# Patient Record
Sex: Female | Born: 1990 | Race: Black or African American | Hispanic: No | Marital: Single | Smoking: Never smoker
Health system: Southern US, Community
[De-identification: ages and names within clinical notes are randomized; demographics above are authoritative.]

---

## 2017-07-07 ENCOUNTER — Encounter: Payer: Self-pay | Admitting: Family Medicine

## 2017-07-07 ENCOUNTER — Other Ambulatory Visit: Payer: Self-pay

## 2017-07-07 ENCOUNTER — Ambulatory Visit: Payer: Managed Care, Other (non HMO) | Admitting: Family Medicine

## 2017-07-07 VITALS — BP 126/78 | HR 87 | Temp 98.5°F | Ht 67.72 in | Wt 223.0 lb

## 2017-07-07 DIAGNOSIS — Z30011 Encounter for initial prescription of contraceptive pills: Secondary | ICD-10-CM

## 2017-07-07 DIAGNOSIS — Z13220 Encounter for screening for lipoid disorders: Secondary | ICD-10-CM

## 2017-07-07 DIAGNOSIS — Z131 Encounter for screening for diabetes mellitus: Secondary | ICD-10-CM | POA: Diagnosis not present

## 2017-07-07 DIAGNOSIS — Z23 Encounter for immunization: Secondary | ICD-10-CM

## 2017-07-07 DIAGNOSIS — N920 Excessive and frequent menstruation with regular cycle: Secondary | ICD-10-CM | POA: Diagnosis not present

## 2017-07-07 DIAGNOSIS — Z01419 Encounter for gynecological examination (general) (routine) without abnormal findings: Secondary | ICD-10-CM

## 2017-07-07 LAB — POCT URINE PREGNANCY: Preg Test, Ur: NEGATIVE

## 2017-07-07 MED ORDER — NORGESTIMATE-ETH ESTRADIOL 0.25-35 MG-MCG PO TABS: 1.0000 | ORAL_TABLET | Freq: Every day | ORAL | 11 refills | Status: DC

## 2017-07-07 NOTE — Progress Notes (Signed)
7/79/390300:92 AM  Sheryl Keller [birth year], 27 y.o. female [id number]  Chief Complaint  Patient presents with  . Establish Care    HPI:   Patient is a 27 y.o. female who presents today for annual exam.   Last CPE 2017 Last pap 2 years ago, normal Denies h/o abnl pap or STDs Menarche at age 23 Monthly, 7 days, heavy with clots, uses tampons and pads together, changes about every hour, no very painful, but does report a chronic intermittent R pelvic pain, does not think its related to menses LMP 06/12/17 G0 Currently not sexually active. Men Last sexual encounter 2015, has been tested for STDs since then, negative  She has been working really hard on losing weight with diet and exercise Has lost 70lbs Immunizations reviewed, declines flu vaccine  Denies h/o migraines, VTE, her mother had a CVA at age 80 - very high uncontrolled HTN No fhx breast or ovarian cancer   Immunization History  Administered Date(s) Administered  . DTaP 07/14/1993, 10/07/1993, 11/12/1993, 09/06/1994, 06/25/1998  . HPV Quadrivalent 11/29/2011  . Hepatitis A 04/26/2010, 11/29/2011  . Hepatitis B April 11, 1993, 08/05/1993, 07/24/1995  . HiB 07/14/1993, 10/07/1993, 11/12/1993, 09/06/1994  . IPV 07/14/1993, 10/07/1993, 09/06/1994, 06/25/1998  . MMR 09/06/1994, 06/25/1998  . Meningococcal Conjugate 04/26/2010  . Td 04/26/2010  . Tdap 04/26/2010  . Varicella 07/24/1995, 04/26/2010    Depression screen PHQ 2/9 07/07/2017  Decreased Interest 0  Down, Depressed, Hopeless 0  PHQ - 2 Score 0    Not on File  Prior to Admission medications   Not on File    History reviewed. No pertinent past medical history.  History reviewed. No pertinent surgical history.  Social History   Tobacco Use  . Smoking status: Never Smoker  . Smokeless tobacco: Never Used  Substance Use Topics  . Alcohol use: No    Frequency: Never    Family History  Problem Relation Age of Onset  . Hypertension Mother     . Diabetes Mother   . Stroke Mother   . Healthy Father   . Stroke Maternal Grandmother   . Diabetes Maternal Grandfather     Review of Systems  Constitutional: Negative for chills and fever.  HENT: Negative for congestion, ear pain and sore throat.   Eyes: Negative for blurred vision and double vision.  Respiratory: Negative for cough and shortness of breath.   Cardiovascular: Negative for chest pain, palpitations and leg swelling.  Gastrointestinal: Negative for abdominal pain, nausea and vomiting.  Genitourinary: Negative for dysuria and hematuria.       Neg breast lumps or nipple discharge Neg vaginal discharge, dyspareunia  Musculoskeletal: Negative for joint pain and myalgias.  Neurological: Negative for dizziness and headaches.  Endo/Heme/Allergies: Does not bruise/bleed easily.  Psychiatric/Behavioral: Negative for depression. The patient is not nervous/anxious.     OBJECTIVE:  Blood pressure 126/78, pulse 87, temperature 98.5 F (36.9 C), temperature source Oral, height 5' 7.72" (1.72 m), weight 223 lb (101.2 kg), last menstrual period 06/12/2017, SpO2 100 %.  Physical Exam  Constitutional: She is oriented to person, place, and time and well-developed, well-nourished, and in no distress.  HENT:  Head: Normocephalic and atraumatic.  Right Ear: Hearing, tympanic membrane, external ear and ear canal normal.  Left Ear: Hearing, tympanic membrane, external ear and ear canal normal.  Mouth/Throat: Oropharynx is clear and moist.  Eyes: EOM are normal. Pupils are equal, round, and reactive to light.  Neck: Neck supple. No thyromegaly present.  Cardiovascular: Normal rate,  regular rhythm, normal heart sounds and intact distal pulses. Exam reveals no gallop and no friction rub.  No murmur heard. Pulmonary/Chest: Effort normal and breath sounds normal. She has no wheezes. She has no rales.  Abdominal: Soft. Bowel sounds are normal. She exhibits no distension and no mass. There  is no tenderness.  Genitourinary: Uterus is not enlarged and not fixed.  Cervix is not fixed. Cervix exhibits no motion tenderness and no lesion. Right adnexum displays no mass and no tenderness. Left adnexum displays no mass and no tenderness. Vulva exhibits no erythema, no lesion and no rash. Vagina exhibits rugosity. Vagina exhibits normal mucosa. Watery  white and vaginal discharge found.  Musculoskeletal: Normal range of motion. She exhibits no edema.  Lymphadenopathy:    She has no cervical adenopathy.  Neurological: She is alert and oriented to person, place, and time. She has normal reflexes. Gait normal.  Skin: Skin is warm and dry.  Psychiatric: Mood and affect normal.  Nursing note and vitals reviewed.   Results for orders placed or performed in visit on 07/07/17 (from the past 24 hour(s))  POCT urine pregnancy     Status: None   Collection Time: 07/07/17 11:30 AM  Result Value Ref Range   Preg Test, Ur Negative Negative    ASSESSMENT and PLAN  1. Encounter for annual routine gynecological examination No concerns per history or exam. Routine HCM labs ordered. HCM reviewed/discussed. Anticipatory guidance regarding healthy weight, lifestyle and choices given.  - Td vaccine greater than or equal to 7yo preservative free IM - CBC - Comprehensive metabolic panel - Pap IG w/ reflex to HPV when ASC-U  2. Encounter for initial prescription of contraceptive pills - POCT urine pregnancy  3. Menorrhagia with regular cycle Treatment options discussed. [place] to r/o structural causes and for right pelvica pain. Discussed new med r/se/b. Patient educational handout given  - CBC - TSH - US PELVIC COMPLETE WITH TRANSVAGINAL; Future  4. Need for vaccination - HPV 9-valent vaccine,Recombinat  5. Screening for lipid disorders - Lipid panel  6. Screening for diabetes mellitus - Hemoglobin A1c  Other orders - norgestimate-ethinyl estradiol (ORTHO-CYCLEN,SPRINTEC,PREVIFEM) 0.25-35  MG-MCG tablet; Take 1 tablet by mouth daily.  Return in about 4 weeks (around 08/04/2017).    Rutherford Guys, MD Primary Care at [facility] [place], [place] [address] Ph.  [phone] Fax [phone]

## 2017-07-07 NOTE — Patient Instructions (Addendum)
1. Next HPV vaccine due in 3 months    IF you received an x-ray today, you will receive an invoice from [facility]. Please contact [facility] at [phone] with questions or concerns regarding your invoice.   IF you received labwork today, you will receive an invoice from [facility]. Please contact [facility] at [phone] with questions or concerns regarding your invoice.   Our billing staff will not be able to assist you with questions regarding bills from these companies.  You will be contacted with the lab results as soon as they are available. The fastest way to get your results is to activate your My Chart account. Instructions are located on the last page of this paperwork. If you have not heard from [place] regarding the results in 2 weeks, please contact this office.     Preventive Care 18-39 Years, Female Preventive care refers to lifestyle choices and visits with your health care provider that can promote health and wellness. What does preventive care include?  A yearly physical exam. This is also called an annual well check.  Dental exams once or twice a year.  Routine eye exams. Ask your health care provider how often you should have your eyes checked.  Personal lifestyle choices, including: ? Daily care of your teeth and gums. ? Regular physical activity. ? Eating a healthy diet. ? Avoiding tobacco and drug use. ? Limiting alcohol use. ? Practicing safe sex. ? Taking vitamin and mineral supplements as recommended by your health care provider. What happens during an annual well check? The services and screenings done by your health care provider during your annual well check will depend on your age, overall health, lifestyle risk factors, and family history of disease. Counseling Your health care provider may ask you questions about your:  Alcohol use.  Tobacco use.  Drug use.  Emotional well-being.  Home and relationship well-being.  Sexual  activity.  Eating habits.  Work and work Statistician.  Method of birth control.  Menstrual cycle.  Pregnancy history.  Screening You may have the following tests or measurements:  Height, weight, and BMI.  Diabetes screening. This is done by checking your blood sugar (glucose) after you have not eaten for a while (fasting).  Blood pressure.  Lipid and cholesterol levels. These may be checked every 5 years starting at age 4.  Skin check.  Hepatitis C blood test.  Hepatitis B blood test.  Sexually transmitted disease (STD) testing.  BRCA-related cancer screening. This may be done if you have a family history of breast, ovarian, tubal, or peritoneal cancers.  Pelvic exam and Pap test. This may be done every 3 years starting at age 34. Starting at age 34, this may be done every 5 years if you have a Pap test in combination with an HPV test.  Discuss your test results, treatment options, and if necessary, the need for more tests with your health care provider. Vaccines Your health care provider may recommend certain vaccines, such as:  Influenza vaccine. This is recommended every year.  Tetanus, diphtheria, and acellular pertussis (Tdap, Td) vaccine. You may need a Td booster every 10 years.  Varicella vaccine. You may need this if you have not been vaccinated.  HPV vaccine. If you are 49 or younger, you may need three doses over 6 months.  Measles, mumps, and rubella (MMR) vaccine. You may need at least one dose of MMR. You may also need a second dose.  Pneumococcal 13-valent conjugate (PCV13) vaccine. You may  need this if you have certain conditions and were not previously vaccinated.  Pneumococcal polysaccharide (PPSV23) vaccine. You may need one or two doses if you smoke cigarettes or if you have certain conditions.  Meningococcal vaccine. One dose is recommended if you are age 66-21 years and a first-year college student living in a residence hall, or if you have  one of several medical conditions. You may also need additional booster doses.  Hepatitis A vaccine. You may need this if you have certain conditions or if you travel or work in places where you may be exposed to hepatitis A.  Hepatitis B vaccine. You may need this if you have certain conditions or if you travel or work in places where you may be exposed to hepatitis B.  Haemophilus influenzae type b (Hib) vaccine. You may need this if you have certain risk factors.  Talk to your health care provider about which screenings and vaccines you need and how often you need them. This information is not intended to replace advice given to you by your health care provider. Make sure you discuss any questions you have with your health care provider. Document Released: 07/05/2001 Document Revised: 01/27/2016 Document Reviewed: 03/10/2015 Elsevier Interactive Patient Education  [employer].

## 2017-07-08 LAB — COMPREHENSIVE METABOLIC PANEL
ALT: 15 IU/L (ref 0–32)
AST: 16 IU/L (ref 0–40)
Albumin/Globulin Ratio: 1.3 (ref 1.2–2.2)
Albumin: 3.9 g/dL (ref 3.5–5.5)
Alkaline Phosphatase: 64 IU/L (ref 39–117)
BUN/Creatinine Ratio: 15 (ref 9–23)
BUN: 11 mg/dL (ref 6–20)
Bilirubin Total: <0.2 mg/dL (ref 0.0–1.2)
CO2: 21 mmol/L (ref 20–29)
Calcium: 9.3 mg/dL (ref 8.7–10.2)
Chloride: 103 mmol/L (ref 96–106)
Creatinine, Ser: 0.71 mg/dL (ref 0.57–1.00)
GFR calc Af Amer: 138 mL/min/{1.73_m2} (ref >59)
GFR calc non Af Amer: 120 mL/min/{1.73_m2} (ref >59)
Globulin, Total: 3.1 g/dL (ref 1.5–4.5)
Glucose: 85 mg/dL (ref 65–99)
Potassium: 4.6 mmol/L (ref 3.5–5.2)
Sodium: 138 mmol/L (ref 134–144)
Total Protein: 7 g/dL (ref 6.0–8.5)

## 2017-07-08 LAB — LIPID PANEL
Chol/HDL Ratio: 2.6 ratio (ref 0.0–4.4)
Cholesterol, Total: 151 mg/dL (ref 100–199)
HDL: 59 mg/dL (ref >39)
LDL Calculated: 85 mg/dL (ref 0–99)
Triglycerides: 36 mg/dL (ref 0–149)
VLDL Cholesterol Cal: 7 mg/dL (ref 5–40)

## 2017-07-08 LAB — CBC
Hematocrit: 33.5 % — ABNORMAL LOW (ref 34.0–46.6)
Hemoglobin: 9.8 g/dL — ABNORMAL LOW (ref 11.1–15.9)
MCH: 21.8 pg — ABNORMAL LOW (ref 26.6–33.0)
MCHC: 29.3 g/dL — ABNORMAL LOW (ref 31.5–35.7)
MCV: 75 fL — ABNORMAL LOW (ref 79–97)
Platelets: 391 10*3/uL — ABNORMAL HIGH (ref 150–379)
RBC: 4.49 x10E6/uL (ref 3.77–5.28)
RDW: 20.9 % — ABNORMAL HIGH (ref 12.3–15.4)
WBC: 6.5 10*3/uL (ref 3.4–10.8)

## 2017-07-08 LAB — TSH: TSH: 1.52 u[IU]/mL (ref 0.450–4.500)

## 2017-07-08 LAB — HEMOGLOBIN A1C
Est. average glucose Bld gHb Est-mCnc: 105 mg/dL
Hgb A1c MFr Bld: 5.3 % (ref 4.8–5.6)

## 2017-07-11 ENCOUNTER — Telehealth: Payer: Self-pay | Admitting: Family Medicine

## 2017-07-11 ENCOUNTER — Other Ambulatory Visit: Payer: Self-pay | Admitting: Family Medicine

## 2017-07-11 MED ORDER — FERROUS GLUCONATE 324 (38 FE) MG PO TABS: 324.0000 mg | ORAL_TABLET | Freq: Every day | ORAL | 3 refills | Status: DC

## 2017-07-11 NOTE — Telephone Encounter (Signed)
Called and left pt a VM asking her to call the office and reschedule her appt she has scheduled with Dr. Leretha PolSantiago on 08/04/17.  When pt calls back, please have her reschedule for a different day with Dr. Leretha PolSantiago.  Thanks!

## 2017-07-12 ENCOUNTER — Telehealth: Payer: Self-pay

## 2017-07-12 LAB — PAP IG W/ RFLX HPV ASCU: PAP Smear Comment: 0

## 2017-07-12 NOTE — Telephone Encounter (Signed)
Copied from CRM [phone]. Topic: General - Call Back - No Documentation >> Jul 11, 2017  4:52 PM Landry MellowFoltz, Melissa J wrote: Reason for CRM: pt missed call, is returning, no crm  Please call [phone]

## 2017-07-12 NOTE — Telephone Encounter (Signed)
Phone call to patient. Relayed lab results. She states she has an iron tablet that she doesn't take every day. Recommended she pick up prescription iron tablet from pharmacy and take every day. Patient wants to know if she is prediabetic, reviewed with patient A1c and Glucose in CMP are normal. Patient agreeable, no further questions.

## 2017-08-04 ENCOUNTER — Ambulatory Visit: Payer: Managed Care, Other (non HMO) | Admitting: Family Medicine

## 2017-08-07 ENCOUNTER — Encounter: Payer: Self-pay | Admitting: Family Medicine

## 2017-08-07 ENCOUNTER — Other Ambulatory Visit: Payer: Self-pay

## 2017-08-07 ENCOUNTER — Ambulatory Visit: Payer: Managed Care, Other (non HMO) | Admitting: Family Medicine

## 2017-08-07 VITALS — BP 122/60 | HR 99 | Temp 99.1°F | Ht 67.72 in | Wt 222.2 lb

## 2017-08-07 DIAGNOSIS — R0982 Postnasal drip: Secondary | ICD-10-CM | POA: Diagnosis not present

## 2017-08-07 DIAGNOSIS — N920 Excessive and frequent menstruation with regular cycle: Secondary | ICD-10-CM | POA: Diagnosis not present

## 2017-08-07 MED ORDER — FLUTICASONE PROPIONATE 50 MCG/ACT NA SUSP: 1.0000 | Freq: Every day | NASAL | 6 refills | Status: DC

## 2017-08-07 MED ORDER — NORGESTIMATE-ETH ESTRADIOL 0.25-35 MG-MCG PO TABS: 1.0000 | ORAL_TABLET | Freq: Every day | ORAL | 3 refills | Status: DC

## 2017-08-07 NOTE — Progress Notes (Signed)
3/18/20198:33 AM  Sheryl LaurenceFelicia Keller 1990-07-20, 27 y.o. female [id number]  Chief Complaint  Patient presents with  . Follow-up    follow up on gyn exam, cycle started after completing the 28th day of pill was very heavy, with clots. Having nausea, heavy cramping. Has been bleeding all month    HPI:   Patient is a 27 y.o. female with past medical history significant for menorrhagia who presents today for follow up after start OCPs  Overall tolerating well reports light bleeding during most of the month Cycle during placebo pills with cramping which is unusual for her and heavier than normal Taking iron without issues Has not been called for pelvic us appt yet  Also wondering about sinus infection Reports intermittent episodes with nasal congestion, yellow drainage Denies any other URI sx, has occasional sneezing States had a fever on first day of most recent episode Took OTC cold meds for couple of days but has since stopped 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 Authorizing Provider  ampicillin (PRINCIPEN) 500 MG capsule Take 500 mg by mouth 4 (four) times daily.   Yes [provider]  ferrous gluconate (FERGON) 324 MG tablet Take 1 tablet (324 mg total) by mouth daily with breakfast. 07/11/17  Yes Myles LippsSantiago, Shree Espey M, MD  hydrocortisone 2.5 % lotion Apply topically 2 (two) times daily.   Yes [provider]  norgestimate-ethinyl estradiol (ORTHO-CYCLEN,SPRINTEC,PREVIFEM) 0.25-35 MG-MCG tablet Take 1 tablet by mouth daily. 07/07/17  Yes Myles LippsSantiago, Jerlyn Pain M, MD  tretinoin (RETIN-A) 0.025 % cream Apply topically at bedtime.   Yes [provider]    History reviewed. No pertinent past  medical history.  History reviewed. No pertinent surgical history.  Social History   Tobacco Use  . Smoking status: Never Smoker  . Smokeless tobacco: Never Used  Substance Use Topics  . Alcohol use: No    Frequency: Never    Family History  Problem Relation Age of Onset  . Hypertension Mother   . Diabetes Mother   . Stroke Mother   . Healthy Father   . Stroke Maternal Grandmother   . Diabetes Maternal Grandfather     ROS Per hpi  OBJECTIVE:  Blood pressure 122/60, pulse 99, temperature 99.1 F (37.3 C), temperature source Oral, height 5' 7.72" (1.72 m), weight 222 lb 3.2 oz (100.8 kg), last menstrual period 08/01/2017, SpO2 98 %.  Physical Exam  Constitutional: She is oriented to person, place, and time and well-developed, well-nourished, and in no distress.  HENT:  Head: Normocephalic and atraumatic.  Right Ear: Hearing, tympanic membrane, external ear and ear canal normal.  Left Ear: Hearing, tympanic membrane, external ear and ear canal normal.  Nose: Mucosal edema (pale boggy mucosa seen) present. Right sinus exhibits no maxillary sinus tenderness and no frontal sinus tenderness. Left sinus exhibits no maxillary sinus tenderness and no frontal sinus tenderness.  Mouth/Throat: Oropharynx is clear and moist.  Post nasal drip seen, mucoid  Eyes: EOM are normal. Pupils are equal, round, and reactive to light.  Neck: Neck supple.  Cardiovascular: Normal rate, regular rhythm and normal heart sounds. Exam reveals no gallop and no friction rub.  No murmur heard. Pulmonary/Chest:  Effort normal and breath sounds normal. She has no wheezes. She has no rales.  Lymphadenopathy:    She has no cervical adenopathy.  Neurological: She is alert and oriented to person, place, and time. Gait normal.  Skin: Skin is warm and dry.     ASSESSMENT and PLAN 1. Menorrhagia with regular cycle Discussed continuous use of OCPs, r/se/b, patient educational handout given. Will followup on  [place] order  2. Post-nasal drip No ssx of infection currently, seems to be triggered by dry environment. Discussed supportive measures. Starting nasal steroids. RTC precautions reviewed.   Other orders - norgestimate-ethinyl estradiol (ORTHO-CYCLEN,SPRINTEC,PREVIFEM) 0.25-35 MG-MCG tablet; Take 1 tablet by mouth daily. Only take placebo pills every 3rd pack - fluticasone (FLONASE) 50 MCG/ACT nasal spray; Place 1 spray into both nostrils daily.  Return in about 3 months (around 11/07/2017).    Myles Lipps, MD Primary Care at [facility] [address] [place], [place] [address] Ph.  [phone] Fax [phone]

## 2017-08-07 NOTE — Patient Instructions (Addendum)
1. Start using nasal saline sprays twice a day and nigh use of humidifier    IF you received an x-ray today, you will receive [facility]. Please contact [facility] at [phone] with questions or concerns regarding your invoice.   IF you received labwork today, you will receive an invoice from [facility]. Please contact [facility] at [phone] with questions or concerns regarding your invoice.   Our billing staff will not be able to assist you with questions regarding bills from these companies.  You will be contacted with the lab results as soon as they are available. The fastest way to get your results is to activate your My Chart account. Instructions are located on the last page of this paperwork. If you have not heard from us regarding the results in 2 weeks, please contact this office.     Postnasal Drip Postnasal drip is the feeling of mucus going down the back of your throat. Mucus is a slimy substance that moistens and cleans your nose and throat, as well as the air pockets in face bones near your forehead and cheeks (sinuses). Small amounts of mucus pass from your nose and sinuses down the back of your throat all the time. This is normal. When you produce too much mucus or the mucus gets too thick, you can feel it. Some common causes of postnasal drip include:  Having more mucus because of: ? A cold or the flu. ? Allergies. ? Cold air. ? Certain medicines.  Having more mucus that is thicker because of: ? A sinus or nasal infection. ? Dry air. ? A food allergy.  Follow these instructions at home: Relieving discomfort  Gargle with a salt-water mixture 3-4 times a day or as needed. To make a salt-water mixture, completely dissolve -1 tsp of salt in 1 cup of warm water.  If the air in your home is dry, use a humidifier to add moisture to the air.  Use a saline spray or container (neti pot) to flush out the nose (nasal irrigation). These  methods can help clear away mucus and keep the nasal passages moist. General instructions  Take over-the-counter and prescription medicines only as told by your health care provider.  Follow instructions from your health care provider about eating or drinking restrictions. You may need to avoid caffeine.  Avoid things that you know you are allergic to (allergens), like dust, mold, pollen, pets, or certain foods.  Drink enough fluid to keep your urine pale yellow.  Keep all follow-up visits as told by your health care provider. This is important. Contact a health care provider if:  You have a fever.  You have a sore throat.  You have difficulty swallowing.  You have headache.  You have sinus pain.  You have a cough that does not go away.  The mucus from your nose becomes thick and is green or yellow in color.  You have cold or flu symptoms that last more than 10 days. Summary  Postnasal drip is the feeling of mucus going down the back of your throat.  If your health care provider approves, use nasal irrigation or a nasal spray 2?4 times a day.  Avoid things that you know you are allergic to (allergens), like dust, mold, pollen, pets, or certain foods. This information is not intended to replace advice given to you by your health care provider. Make sure you discuss any questions you have with your health care provider. Document Released: 08/22/2016 Document  Revised: 08/22/2016 Document Reviewed: 08/22/2016 Elsevier Interactive Patient Education  [employer].

## 2017-08-18 ENCOUNTER — Ambulatory Visit
Admission: RE | Admit: 2017-08-18 | Discharge: 2017-08-18 | Disposition: A | Discharge status: Home / Self Care | Payer: Managed Care, Other (non HMO) | Source: Ambulatory Visit | Attending: Family Medicine | Admitting: Family Medicine

## 2017-08-18 DIAGNOSIS — N920 Excessive and frequent menstruation with regular cycle: Secondary | ICD-10-CM

## 2017-08-25 ENCOUNTER — Ambulatory Visit: Payer: Managed Care, Other (non HMO) | Admitting: Family Medicine

## 2017-08-25 ENCOUNTER — Other Ambulatory Visit: Payer: Self-pay

## 2017-08-25 ENCOUNTER — Encounter: Payer: Self-pay | Admitting: Family Medicine

## 2017-08-25 VITALS — BP 122/70 | HR 91 | Temp 98.5°F | Resp 18 | Ht 67.72 in | Wt 228.0 lb

## 2017-08-25 DIAGNOSIS — M7660 Achilles tendinitis, unspecified leg: Secondary | ICD-10-CM | POA: Diagnosis not present

## 2017-08-25 MED ORDER — IBUPROFEN 600 MG PO TABS: 600.0000 mg | ORAL_TABLET | Freq: Three times a day (TID) | ORAL | 0 refills | Status: DC | PRN

## 2017-08-25 NOTE — Patient Instructions (Signed)
     IF you received an x-ray today, you will receive an invoice from [facility]. Please contact [facility] at [phone] with questions or concerns regarding your invoice.   IF you received labwork today, you will receive an invoice from [facility]. Please contact [facility] at [phone] with questions or concerns regarding your invoice.   Our billing staff will not be able to assist you with questions regarding bills from these companies.  You will be contacted with the lab results as soon as they are available. The fastest way to get your results is to activate your My Chart account. Instructions are located on the last page of this paperwork. If you have not heard from us regarding the results in 2 weeks, please contact this office.     

## 2017-08-25 NOTE — Progress Notes (Signed)
   4/5/20195:51 PM  Sheryl LaurenceFelicia Keller 06-Sep-1990, 27 y.o. female [id number]  Chief Complaint  Patient presents with  . Ankle Pain    pain in both ankles for 2 weeks hurts more in the morning     HPI:   Patient is a 27 y.o. female who presents today for about 2 weeks of ankle pain, left more than right. She reports pain and intermittent swelling in back of ankle/heel area. She denies any injuries though she has recently started running on the treadmill. She denies any weakness, falls. She states that pain is worse when trying to go up stairs in her home. She has been trying to ice but has not been consistent. Otherwise has not tried much for it. She is starting to limp towards end of the day. She denies any other joint involvements, or history of similar episodes. She denies any redness or warmth of affected area.  [facility] 08/07/2017 07/07/2017  Decreased Interest 0 0  Down, Depressed, Hopeless 0 0  PHQ - 2 Score 0 0    No Known Allergies  Prior to Admission medications   Medication Sig Start Date End Date Taking? Authorizing Provider  ampicillin (PRINCIPEN) 500 MG capsule Take 500 mg by mouth 4 (four) times daily.   Yes [provider]  ferrous gluconate (FERGON) 324 MG tablet Take 1 tablet (324 mg total) by mouth daily with breakfast. 07/11/17  Yes Myles LippsSantiago, Gedalya Jim M, MD  fluticasone [facility](FLONASE) 50 MCG/ACT nasal spray Place 1 spray into both nostrils daily. 08/07/17  Yes Myles LippsSantiago, Travious Vanover M, MD  hydrocortisone 2.5 % lotion Apply topically 2 (two) times daily.   Yes [provider]  norgestimate-ethinyl estradiol (ORTHO-CYCLEN,SPRINTEC,PREVIFEM) 0.25-35 MG-MCG tablet Take 1 tablet by mouth daily. Only take placebo pills every 3rd pack 08/07/17  Yes Myles LippsSantiago, Dianah Pruett M, MD  tretinoin (RETIN-A) 0.025 % cream Apply topically at bedtime.   Yes [provider]    History reviewed. No pertinent past medical history.  History reviewed. No pertinent surgical  history.  Social History   Tobacco Use  . Smoking status: Never Smoker  . Smokeless tobacco: Never Used  Substance Use Topics  . Alcohol use: No    Frequency: Never    Family History  Problem Relation Age of Onset  . Hypertension Mother   . Diabetes Mother   . Stroke Mother   . Healthy Father   . Stroke Maternal Grandmother   . Diabetes Maternal Grandfather     ROS Per hpi  OBJECTIVE:  Blood pressure 122/70, pulse 91, temperature 98.5 F (36.9 C), temperature source Oral, resp. rate 18, height 5' 7.72" (1.72 m), weight 228 lb (103.4 kg), last menstrual period 08/01/2017, SpO2 100 %.  Physical Exam  Gen: AAOx3, NAD MSK: Bilateral ankles FROM, no swelling, TTP just above insertion of achilles tendon, normal response to thompson test. NVI intact.  ASSESSMENT and PLAN  1. Achilles tendon pain Exam suggestive of tendonitis. Discussed RICE therapy, discussed use of either heel cups or achilles tendon braces. Discussed alternative forms of exercise. If not improved with conservative measures, consider referral to ortho.  Other orders - ibuprofen (ADVIL,MOTRIN) 600 MG tablet; Take 1 tablet (600 mg total) by mouth every 8 (eight) hours as needed.  Return in about 2 weeks (around 09/08/2017).    Myles LippsIrma M Santiago, MD Primary Care at [facility] [address] [place], [place] [address] Ph.  [phone] Fax [phone]

## 2017-09-08 ENCOUNTER — Other Ambulatory Visit: Payer: Self-pay

## 2017-09-08 ENCOUNTER — Encounter: Payer: Self-pay | Admitting: Family Medicine

## 2017-09-08 ENCOUNTER — Ambulatory Visit: Payer: Managed Care, Other (non HMO) | Admitting: Family Medicine

## 2017-09-08 VITALS — BP 110/76 | HR 100 | Temp 97.8°F | Ht 66.0 in | Wt 227.0 lb

## 2017-09-08 DIAGNOSIS — M7660 Achilles tendinitis, unspecified leg: Secondary | ICD-10-CM

## 2017-09-08 MED ORDER — NORGESTIMATE-ETH ESTRADIOL 0.25-35 MG-MCG PO TABS: 1.0000 | ORAL_TABLET | Freq: Every day | ORAL | 3 refills | Status: DC

## 2017-09-08 NOTE — Progress Notes (Signed)
4/19/20193:59 PM  Sheryl LaurenceFelicia Keller 02-Nov-1990, 27 y.o. female [id number]  Chief Complaint  Patient presents with  . Follow-up    follow up for achilles tendon pain, says the pain is not constant, taking motrin for pain the does help.    HPI:   Patient is a 27 y.o. female  who presents today for followup on achilles tendon pain. No trauma, started after she started to run on the treadmill at the gym. She has tried 2 weeks of partial conservative measures. Has been taking NSAIDs prn. Trying to elevate as often as possible. Has not really been icing area. Never found brace nor heel cups. Has not been back to the gym. Denies any worsening of pain but also not getting better.  She is also requesting resending OCP prescription for 3 months at a time, she takes consecutive and pharmacy just gave her 1 pack.   [facility] 09/08/2017 08/07/2017 07/07/2017  Decreased Interest 0 0 0  Down, Depressed, Hopeless 0 0 0  PHQ - 2 Score 0 0 0    No Known Allergies  Prior to Admission medications   Medication Sig Start Date End Date Taking? Authorizing Provider  ampicillin (PRINCIPEN) 500 MG capsule Take 500 mg by mouth 4 (four) times daily.   Yes [provider]  ferrous gluconate (FERGON) 324 MG tablet Take 1 tablet (324 mg total) by mouth daily with breakfast. 07/11/17  Yes Myles LippsSantiago, Teagon Kron M, MD  fluticasone [facility](FLONASE) 50 MCG/ACT nasal spray Place 1 spray into both nostrils daily. 08/07/17  Yes Myles LippsSantiago, Jefrey Raburn M, MD  hydrocortisone 2.5 % lotion Apply topically 2 (two) times daily.   Yes [provider]  ibuprofen (ADVIL,MOTRIN) 600 MG tablet Take 1 tablet (600 mg total) by mouth every 8 (eight) hours as needed. 08/25/17  Yes Myles LippsSantiago, Harlie Ragle M, MD  norgestimate-ethinyl estradiol (ORTHO-CYCLEN,SPRINTEC,PREVIFEM) 0.25-35 MG-MCG tablet Take 1 tablet by mouth daily. Only take placebo pills every 3rd pack 08/07/17  Yes Myles LippsSantiago, Hau Sanor M, MD  tretinoin (RETIN-A) 0.025 % cream Apply  topically at bedtime.   Yes [provider]    History reviewed. No pertinent past medical history.  History reviewed. No pertinent surgical history.  Social History   Tobacco Use  . Smoking status: Never Smoker  . Smokeless tobacco: Never Used  Substance Use Topics  . Alcohol use: No    Frequency: Never    Family History  Problem Relation Age of Onset  . Hypertension Mother   . Diabetes Mother   . Stroke Mother   . Healthy Father   . Stroke Maternal Grandmother   . Diabetes Maternal Grandfather     ROS Per hpi  OBJECTIVE:  Blood pressure 110/76, pulse 100, temperature 97.8 F (36.6 C), temperature source Oral, height 5\' 6"  (1.676 m), weight 227 lb (103 kg), last menstrual period 08/08/2017, SpO2 98 %.  Physical Exam  Constitutional: She is oriented to person, place, and time.  HENT:  Head: Normocephalic and atraumatic.  Mouth/Throat: Mucous membranes are normal.  Eyes: Pupils are equal, round, and reactive to light. EOM are normal. No scleral icterus.  Neck: Neck supple.  Pulmonary/Chest: Effort normal.  Neurological: She is alert and oriented to person, place, and time.  Skin: Skin is warm and dry.  Nursing note and vitals reviewed.    ASSESSMENT and PLAN  1. Achilles tendon pain Discussed cont working on conservative measures. Referring to sports medicine for further eval and treatment. - Ambulatory referral to Sports Medicine  Other orders - norgestimate-ethinyl estradiol (ORTHO-CYCLEN,SPRINTEC,PREVIFEM) 0.25-35 MG-MCG tablet; Take 1 tablet by mouth daily. Only take placebo pills every 3rd pack  Return if symptoms worsen or fail to improve.    Myles Lipps, MD Primary Care at [facility] [address] [place], [place] [address] Ph.  [phone] Fax [phone]

## 2017-09-08 NOTE — Patient Instructions (Addendum)
[facility] [address], [place], [place] [address] [phone]      IF you received an x-ray today, you will receive an invoice from [facility]. Please contact [facility] at [phone] with questions or concerns regarding your invoice.   IF you received labwork today, you will receive an invoice from [facility]. Please contact [facility] at [phone] with questions or concerns regarding your invoice.   Our billing staff will not be able to assist you with questions regarding bills from these companies.  You will be contacted with the lab results as soon as they are available. The fastest way to get your results is to activate your My Chart account. Instructions are located on the last page of this paperwork. If you have not heard from us regarding the results in 2 weeks, please contact this office.

## 2017-09-18 ENCOUNTER — Encounter: Payer: Self-pay | Admitting: Family Medicine

## 2017-10-27 ENCOUNTER — Encounter: Payer: Self-pay | Admitting: Family Medicine

## 2017-11-10 ENCOUNTER — Ambulatory Visit: Payer: Managed Care, Other (non HMO) | Admitting: Family Medicine

## 2017-11-17 ENCOUNTER — Other Ambulatory Visit: Payer: Self-pay

## 2017-11-17 ENCOUNTER — Encounter: Payer: Self-pay | Admitting: Family Medicine

## 2017-11-17 ENCOUNTER — Ambulatory Visit: Payer: Managed Care, Other (non HMO) | Admitting: Family Medicine

## 2017-11-17 VITALS — BP 110/84 | HR 78 | Temp 98.6°F | Ht 66.0 in | Wt 223.6 lb

## 2017-11-17 DIAGNOSIS — L83 Acanthosis nigricans: Secondary | ICD-10-CM | POA: Diagnosis not present

## 2017-11-17 DIAGNOSIS — Z3041 Encounter for surveillance of contraceptive pills: Secondary | ICD-10-CM

## 2017-11-17 NOTE — Patient Instructions (Addendum)
     IF you received an x-ray today, you will receive an invoice from [facility]. Please contact [facility] at [phone] with questions or concerns regarding your invoice.   IF you received labwork today, you will receive an invoice from [facility]. Please contact [facility] at [phone] with questions or concerns regarding your invoice.   Our billing staff will not be able to assist you with questions regarding bills from these companies.  You will be contacted with the lab results as soon as they are available. The fastest way to get your results is to activate your My Chart account. Instructions are located on the last page of this paperwork. If you have not heard from us regarding the results in 2 weeks, please contact this office.     

## 2017-11-17 NOTE — Progress Notes (Signed)
   6/28/20192:42 PM  Sheryl Keller 02/22/1991, 27 y.o. female [id number]  Chief Complaint  Patient presents with  . Follow-up    not having any pain in the achilles tendon. Dermatoloigist told her theat she has acanthosis nigricans and needs her insulin levels chhecked    HPI:   Patient is a 27 y.o. female who presents today as recommended by recent dermatologist to have insulin checked due to acanthosis nigricans  Patient has had a recent normal hgb a1c She reports her sister and mother also have acanthosis nigricans She is back at the gym, working on diet and exercise, working on losing weight a1c 5.3, feb 2019  She is on OCPs but prior to that she reports heavy regular menses She is on continuous method, doing ok.  Wanting to continuing method  Fall Risk  11/17/2017 09/08/2017 08/07/2017 07/07/2017  Falls in the past year? No No No No     Depression screen [facility] 2/9 11/17/2017 09/08/2017 08/07/2017  Decreased Interest 0 0 0  Down, Depressed, Hopeless 0 0 0  PHQ - 2 Score 0 0 0    No Known Allergies  Prior to Admission medications   Medication Sig Start Date End Date Taking? Authorizing Provider  fluticasone (FLONASE) 50 MCG/ACT nasal spray Place 1 spray into both nostrils daily. 08/07/17  Yes Myles LippsSantiago, Andri Prestia M, MD  norgestimate-ethinyl estradiol (ORTHO-CYCLEN,SPRINTEC,PREVIFEM) 0.25-35 MG-MCG tablet Take 1 tablet by mouth daily. Only take placebo pills every 3rd pack 09/08/17  Yes Myles LippsSantiago, Rahi Chandonnet M, MD  tretinoin (RETIN-A) 0.025 % cream Apply topically at bedtime.   Yes [provider]    History reviewed. No pertinent past medical history.  History reviewed. No pertinent surgical history.  Social History   Tobacco Use  . Smoking status: Never Smoker  . Smokeless tobacco: Never Used  Substance Use Topics  . Alcohol use: No    Frequency: Never    Family History  Problem Relation Age of Onset  . Hypertension Mother   . Diabetes Mother   . Stroke Mother   .  Healthy Father   . Stroke Maternal Grandmother   . Diabetes Maternal Grandfather     ROS Per hpi  OBJECTIVE:  Blood pressure 110/84, pulse 78, temperature 98.6 F (37 C), temperature source Oral, height 5\' 6"  (1.676 m), weight 223 lb 9.6 oz (101.4 kg), last menstrual period 10/30/2017, SpO2 100 %.  Wt Readings from Last 3 Encounters:  11/17/17 223 lb 9.6 oz (101.4 kg)  09/08/17 227 lb (103 kg)  08/25/17 228 lb (103.4 kg)    Physical Exam  Constitutional: She is oriented to person, place, and time. She appears well-developed and well-nourished.  HENT:  Head: Normocephalic and atraumatic.  Mouth/Throat: Mucous membranes are normal.  Eyes: Pupils are equal, round, and reactive to light. EOM are normal. No scleral icterus.  Neck: Neck supple.  Pulmonary/Chest: Effort normal.  Neurological: She is alert and oriented to person, place, and time.  Skin: Skin is warm and dry.  Psychiatric: She has a normal mood and affect.  Nursing note and vitals reviewed.    ASSESSMENT and PLAN  1. Acanthosis nigricans Normal a1c. Continue weight loss methods.   2. Encounter for surveillance of contraceptive pills Doing well. Continue with method.  Return for Feb 2020 for CPE.    Myles LippsIrma M Santiago, MD Primary Care at [facility] [address] [place], [place] [address] Ph.  [phone] Fax [phone]

## 2017-12-01 ENCOUNTER — Encounter: Payer: Self-pay | Admitting: Physician Assistant

## 2017-12-01 ENCOUNTER — Other Ambulatory Visit: Payer: Self-pay

## 2017-12-01 ENCOUNTER — Ambulatory Visit: Payer: Managed Care, Other (non HMO) | Admitting: Physician Assistant

## 2017-12-01 VITALS — BP 112/60 | HR 77 | Temp 98.8°F | Resp 18 | Ht 67.8 in | Wt 223.8 lb

## 2017-12-01 DIAGNOSIS — Z7251 High risk heterosexual behavior: Secondary | ICD-10-CM

## 2017-12-01 DIAGNOSIS — Z113 Encounter for screening for infections with a predominantly sexual mode of transmission: Secondary | ICD-10-CM | POA: Diagnosis not present

## 2017-12-01 DIAGNOSIS — Z3202 Encounter for pregnancy test, result negative: Secondary | ICD-10-CM | POA: Diagnosis not present

## 2017-12-01 LAB — POCT URINE PREGNANCY: PREG TEST UR: NEGATIVE

## 2017-12-01 NOTE — Progress Notes (Signed)
   Sheryl Keller  MRN: [id number] DOB: 01/30/1991  Subjective:  Sheryl Keller is a 27 y.o. female seen in office today for a chief complaint of need of STD testing.  Patient has new sexual partner as of last week and would like to have STD testing.  Did not use condoms.  No known exposure. She is not currently having any symptoms.  Denies dysuria, vaginal discharge, abdominal pain, vaginal itching, nausea, vomiting, and fever.  She is on daily contraceptive.  Takes medication near the same time every day and does not miss a dose.  LMP 10/30/2017.  Her cycles are typically a little irregular.  No other questions or concerns today.  Review of Systems  Genitourinary: Negative for dyspareunia, enuresis, flank pain, frequency, genital sores, hematuria, urgency and vaginal pain.    Patient Active Problem List   Diagnosis Date Noted  . Menorrhagia with regular cycle 08/07/2017    Current Outpatient Medications on File Prior to Visit  Medication Sig Dispense Refill  . fluticasone (FLONASE) 50 MCG/ACT nasal spray Place 1 spray into both nostrils daily. 16 g 6  . norgestimate-ethinyl estradiol (ORTHO-CYCLEN,SPRINTEC,PREVIFEM) 0.25-35 MG-MCG tablet Take 1 tablet by mouth daily. Only take placebo pills every 3rd pack 3 Package 3  . tretinoin (RETIN-A) 0.025 % cream Apply topically at bedtime.    . clindamycin (CLEOCIN T) 1 % lotion APPLY TO AFFECTED AREA ONCE A DAY IN MORNING  3   No current facility-administered medications on file prior to visit.     No Known Allergies   Objective:  BP 112/60 (BP Location: Right Arm, Patient Position: Sitting, Cuff Size: Large)   Pulse 77   Temp 98.8 F (37.1 C) (Oral)   Resp 18   Ht 5' 7.8" (1.722 m)   Wt 223 lb 12.8 oz (101.5 kg)   LMP 10/30/2017 (Approximate)   SpO2 99%   BMI 34.23 kg/m   Physical Exam  Constitutional: She is oriented to person, place, and time. She appears well-developed and well-nourished.  HENT:  Head: Normocephalic and  atraumatic.  Eyes: Conjunctivae are normal.  Neck: Normal range of motion.  Pulmonary/Chest: Effort normal.  Neurological: She is alert and oriented to person, place, and time.  Skin: Skin is warm and dry.  Psychiatric: She has a normal mood and affect.  Vitals reviewed.  Results for orders placed or performed in visit on 12/01/17 (from the past 24 hour(s))  POCT urine pregnancy     Status: None   Collection Time: 12/01/17 12:03 PM  Result Value Ref Range   Preg Test, Ur Negative Negative    Assessment and Plan :  1. Unprotected sexual intercourse She is asx. Pregnancy test negative.  Labs pending.  Educated on safe sex practices.  Follow-up as needed. - HIV antibody - RPR - GC/Chlamydia Probe Amp([facility]) - Trichomonas vaginalis, RNA - Hepatitis panel, acute - POCT urine pregnancy   Benjiman CoreBrittany Britany Callicott PA-C  [facility] at [facility]  [facility] 12/01/2017 12:07 PM

## 2017-12-01 NOTE — Patient Instructions (Addendum)
We should have your lab results back within a few days and will notify you via mychart. Thank you for letting me participate in your health and well being.     Safe Sex Practicing safe sex means taking steps before and during sex to reduce your risk of:  Getting an STD (sexually transmitted disease).  Giving your partner an STD.  Unwanted pregnancy.  How can I practice safe sex?  To practice safe sex:  Limit your sexual partners to only one partner who is having sex with only you.  Avoid using alcohol and recreational drugs before having sex. These substances can affect your judgment.  Before having sex with a new partner: ? Talk to your partner about past partners, past STDs, and drug use. ? You and your partner should be screened for STDs and discuss the results with each other.  Check your body regularly for sores, blisters, rashes, or unusual discharge. If you notice any of these problems, visit your health care provider.  If you have symptoms of an infection or you are being treated for an STD, avoid sexual contact.  While having sex, use a condom. Make sure to: ? Use a condom every time you have vaginal, oral, or anal sex. Both females and males should wear condoms during oral sex. ? Keep condoms in place from the beginning to the end of sexual activity. ? Use a latex condom, if possible. Latex condoms offer the best protection. ? Use only water-based lubricants or oils to lubricate a condom. Using petroleum-based lubricants or oils will weaken the condom and increase the chance that it will break.  See your health care provider for regular screenings, exams, and tests for STDs.  Talk with your health care provider about the form of birth control (contraception) that is best for you.  Get vaccinated against hepatitis B and human papillomavirus (HPV).  If you are at risk of being infected with HIV (human immunodeficiency virus), talk with your health care provider about  taking a prescription medicine to prevent HIV infection. You are considered at risk for HIV if: ? You are a man who has sex with other men. ? You are a heterosexual man or woman who is sexually active with more than one partner. ? You take drugs by injection. ? You are sexually active with a partner who has HIV.  This information is not intended to replace advice given to you by your health care provider. Make sure you discuss any questions you have with your health care provider. Document Released: 06/16/2004 Document Revised: 09/23/2015 Document Reviewed: 03/29/2015 Elsevier Interactive Patient Education  2018 [employer]     IF you received an x-ray today, you will receive an invoice from [facility]. Please contact [facility] at [phone] with questions or concerns regarding your invoice.   IF you received labwork today, you will receive an invoice from [facility]. Please contact [facility] at [phone] with questions or concerns regarding your invoice.   Our billing staff will not be able to assist you with questions regarding bills from these companies.  You will be contacted with the lab results as soon as they are available. The fastest way to get your results is to activate your My Chart account. Instructions are located on the last page of this paperwork. If you have not heard from us regarding the results in 2 weeks, please contact this office.

## 2017-12-02 LAB — RPR: RPR Ser Ql: NONREACTIVE

## 2017-12-02 LAB — HEPATITIS PANEL, ACUTE
HEP A IGM: NEGATIVE
HEP B S AG: NEGATIVE
Hep B C IgM: NEGATIVE
Hep C Virus Ab: <0.1 s/co ratio (ref 0.0–0.9)

## 2017-12-02 LAB — HIV ANTIBODY (ROUTINE TESTING W REFLEX): HIV SCREEN 4TH GENERATION: NONREACTIVE

## 2017-12-03 LAB — GC/CHLAMYDIA PROBE AMP
Chlamydia trachomatis, NAA: NEGATIVE
Neisseria gonorrhoeae by PCR: NEGATIVE

## 2017-12-03 LAB — TRICHOMONAS VAGINALIS, PROBE AMP: TRICH VAG BY NAA: NEGATIVE

## 2018-05-28 ENCOUNTER — Telehealth: Payer: Self-pay | Admitting: Family Medicine

## 2018-05-28 NOTE — Telephone Encounter (Signed)
Due to a template problem, pt will need to be rescheduled. Dr. Leretha Pol is not in the office this day. I spoke with pt and was able to get them rescheduled with [facility] on 05/30/18 at 8:00 am.. I advised of time, building number and late policy.

## 2018-05-30 ENCOUNTER — Ambulatory Visit: Payer: Managed Care, Other (non HMO) | Admitting: Physician Assistant

## 2018-05-30 ENCOUNTER — Other Ambulatory Visit: Payer: Self-pay

## 2018-05-30 ENCOUNTER — Encounter: Payer: Self-pay | Admitting: Physician Assistant

## 2018-05-30 ENCOUNTER — Ambulatory Visit: Payer: Managed Care, Other (non HMO) | Admitting: Family Medicine

## 2018-05-30 VITALS — BP 112/71 | HR 79 | Temp 98.5°F | Resp 16 | Ht 67.75 in | Wt 249.0 lb

## 2018-05-30 DIAGNOSIS — Z202 Contact with and (suspected) exposure to infections with a predominantly sexual mode of transmission: Secondary | ICD-10-CM | POA: Diagnosis not present

## 2018-05-30 DIAGNOSIS — Z7251 High risk heterosexual behavior: Secondary | ICD-10-CM

## 2018-05-30 NOTE — Progress Notes (Signed)
   Sheryl Keller  MRN: [id number] DOB: 1990-08-01  PCP: Sheryl Lipps, MD  Subjective:  Ptis a pleasant 28 year old female who presents to clinic for STD testing. She is not having symptoms today.  She was tested 12/01/17. Negative. She has had a new partner since that time and would like testing. They did not use condoms.  Denies dysuria, vaginal discharge, abdominal pain, vaginal itching, nausea, vomiting, and fever.  No concern regarding pregnancy.  PCP Dr. Leretha Keller. Last PAP in Feb 2019 - negative.   Review of Systems  Constitutional: Negative for chills and fever.  Cardiovascular: Negative for chest pain and palpitations.  Gastrointestinal: Negative for abdominal pain, diarrhea, nausea and vomiting.  Genitourinary: Negative for decreased urine volume, difficulty urinating, dyspareunia, dysuria, enuresis, flank pain, frequency, hematuria, menstrual problem, pelvic pain, urgency, vaginal bleeding, vaginal discharge and vaginal pain.  Musculoskeletal: Negative for back pain.  Neurological: Negative for light-headedness and headaches.    Patient Active Problem List   Diagnosis Date Noted  . Menorrhagia with regular cycle 08/07/2017    Current Outpatient Medications on File Prior to Visit  Medication Sig Dispense Refill  . clindamycin (CLEOCIN T) 1 % lotion APPLY TO AFFECTED AREA ONCE A DAY IN MORNING  3  . norgestimate-ethinyl estradiol (ORTHO-CYCLEN,SPRINTEC,PREVIFEM) 0.25-35 MG-MCG tablet Take 1 tablet by mouth daily. Only take placebo pills every 3rd pack 3 Package 3  . tretinoin (RETIN-A) 0.025 % cream Apply topically at bedtime.    . fluticasone (FLONASE) 50 MCG/ACT nasal spray Place 1 spray into both nostrils daily. (Patient not taking: Reported on 05/30/2018) 16 g 6   No current facility-administered medications on file prior to visit.     No Known Allergies   Objective:  BP 112/71 (BP Location: Right Arm, Patient Position: Sitting, Cuff Size: Large)   Pulse 79    Temp 98.5 F (36.9 C) (Oral)   Resp 16   Ht 5' 7.75" (1.721 m)   Wt 249 lb (112.9 kg)   LMP 05/15/2018   SpO2 96%   BMI 38.14 kg/m   Physical Exam Vitals signs and nursing note reviewed.  Constitutional:      Appearance: Normal appearance.  Abdominal:     Palpations: Abdomen is soft.     Tenderness: There is no abdominal tenderness. There is no right CVA tenderness or left CVA tenderness.  Neurological:     Mental Status: She is alert.  Psychiatric:        Mood and Affect: Mood normal.        Behavior: Behavior normal.     Assessment and Plan :  1. Possible exposure to STD 2. Unprotected sex - Pt here for routine STD testing. PAP negative - done 06/2017. Safe sex practices discussed and encouraged.  - Chlamydia/Gonococcus/Trichomonas, NAA - HIV Antibody (routine testing w rflx) - RPR   Sheryl Collie, PA-C  Primary Care at [facility] 05/30/2018 8:24 AM  Please note: Portions of this report may have been transcribed using dragon voice recognition software. Every effort was made to ensure accuracy; however, inadvertent computerized transcription errors may be present.

## 2018-05-30 NOTE — Patient Instructions (Addendum)
   Safe Sex Practicing safe sex means taking steps before and during sex to reduce your risk of:  Getting an STD (sexually transmitted disease).  Giving your partner an STD.  Unwanted pregnancy. How can I practice safe sex? To practice safe sex:  Limit your sexual partners to only one partner who is having sex with only you.  Avoid using alcohol and recreational drugs before having sex. These substances can affect your judgment.  Before having sex with a new partner: ? Talk to your partner about past partners, past STDs, and drug use. ? You and your partner should be screened for STDs and discuss the results with each other.  Check your body regularly for sores, blisters, rashes, or unusual discharge. If you notice any of these problems, visit your health care provider.  If you have symptoms of an infection or you are being treated for an STD, avoid sexual contact.  While having sex, use a condom. Make sure to: ? Use a condom every time you have vaginal, oral, or anal sex. Both females and males should wear condoms during oral sex. ? Keep condoms in place from the beginning to the end of sexual activity. ? Use a latex condom, if possible. Latex condoms offer the best protection. ? Use only water-based lubricants or oils to lubricate a condom. Using petroleum-based lubricants or oils will weaken the condom and increase the chance that it will break.  See your health care provider for regular screenings, exams, and tests for STDs.  Talk with your health care provider about the form of birth control (contraception) that is best for you.  Get vaccinated against hepatitis B and human papillomavirus (HPV).  If you are at risk of being infected with HIV (human immunodeficiency virus), talk with your health care provider about taking a prescription medicine to prevent HIV infection. You are considered at risk for HIV if: ? You are a man who has sex with other men. ? You are a  heterosexual man or woman who is sexually active with more than one partner. ? You take drugs by injection. ? You are sexually active with a partner who has HIV. This information is not intended to replace advice given to you by your health care provider. Make sure you discuss any questions you have with your health care provider. Document Released: 06/16/2004 Document Revised: 09/23/2015 Document Reviewed: 03/29/2015 Elsevier Interactive Patient Education  [employer].   If you have lab work done today you will be contacted with your lab results within the next 2 weeks.  If you have not heard from [place] then please contact us. The fastest way to get your results is to register for My Chart.   IF you received an x-ray today, you will receive an invoice from [facility]. Please contact [facility] at [phone] with questions or concerns regarding your invoice.   IF you received labwork today, you will receive an invoice from [facility]. Please contact [facility] at [phone] with questions or concerns regarding your invoice.   Our billing staff will not be able to assist you with questions regarding bills from these companies.  You will be contacted with the lab results as soon as they are available. The fastest way to get your results is to activate your My Chart account. Instructions are located on the last page of this paperwork. If you have not heard from [place] regarding the results in 2 weeks, please contact this office.

## 2018-05-31 LAB — HIV ANTIBODY (ROUTINE TESTING W REFLEX): HIV Screen 4th Generation wRfx: NONREACTIVE

## 2018-05-31 LAB — RPR: RPR Ser Ql: NONREACTIVE

## 2018-06-01 LAB — CHLAMYDIA/GONOCOCCUS/TRICHOMONAS, NAA
Chlamydia by NAA: NEGATIVE
Gonococcus by NAA: NEGATIVE
Trich vag by NAA: NEGATIVE

## 2018-06-02 NOTE — Progress Notes (Signed)
STD panel negative. Results released to mychart.

## 2018-07-13 ENCOUNTER — Encounter: Payer: Managed Care, Other (non HMO) | Admitting: Family Medicine

## 2018-07-20 ENCOUNTER — Encounter: Payer: Managed Care, Other (non HMO) | Admitting: Family Medicine

## 2018-07-23 ENCOUNTER — Ambulatory Visit: Payer: Managed Care, Other (non HMO) | Admitting: Family Medicine

## 2018-07-23 ENCOUNTER — Other Ambulatory Visit: Payer: Self-pay

## 2018-07-23 ENCOUNTER — Encounter: Payer: Self-pay | Admitting: Family Medicine

## 2018-07-23 VITALS — BP 117/79 | HR 77 | Temp 98.7°F | Resp 18 | Ht 67.0 in | Wt 249.0 lb

## 2018-07-23 DIAGNOSIS — R109 Unspecified abdominal pain: Secondary | ICD-10-CM

## 2018-07-23 DIAGNOSIS — M7062 Trochanteric bursitis, left hip: Secondary | ICD-10-CM

## 2018-07-23 LAB — POCT URINALYSIS DIP (MANUAL ENTRY)
Bilirubin, UA: NEGATIVE
Blood, UA: NEGATIVE
Glucose, UA: NEGATIVE mg/dL
Ketones, POC UA: NEGATIVE mg/dL
Nitrite, UA: NEGATIVE
Protein Ur, POC: NEGATIVE mg/dL
Spec Grav, UA: 1.02 (ref 1.010–1.025)
Urobilinogen, UA: 0.2 E.U./dL
pH, UA: 6 (ref 5.0–8.0)

## 2018-07-23 MED ORDER — IBUPROFEN 600 MG PO TABS: 600.0000 mg | ORAL_TABLET | Freq: Three times a day (TID) | ORAL | 0 refills | Status: DC | PRN

## 2018-07-23 NOTE — Progress Notes (Signed)
3/2/20201:42 PM  Sheryl Keller Feb 21, 1991, 28 y.o. female [id number]  Chief Complaint  Patient presents with  . Flank Pain    X 2 weeks- left side    HPI:   Patient is a 28 y.o. female who presents today for left sided pain X  2 weeks  She states pain is not really flank but more over her buttock Does not radiate Only present when she is walks or stands for long period of time She denies any inciting event or previous issues She is a side sleeper Took ibuprofen once   Fall Risk  07/23/2018 05/30/2018 12/01/2017 11/17/2017 09/08/2017  Falls in the past year? 0 0 No No No  Number falls in past yr: 0 - - - -     Depression screen [facility] 2/9 07/23/2018 05/30/2018 12/01/2017  Decreased Interest 0 0 0  Down, Depressed, Hopeless 0 0 0  PHQ - 2 Score 0 0 0    No Known Allergies  Prior to Admission medications   Medication Sig Start Date End Date Taking? Authorizing Provider  clindamycin (CLEOCIN T) 1 % lotion APPLY TO AFFECTED AREA ONCE A DAY IN MORNING 10/23/17  Yes [provider]  norgestimate-ethinyl estradiol (ORTHO-CYCLEN,SPRINTEC,PREVIFEM) 0.25-35 MG-MCG tablet Take 1 tablet by mouth daily. Only take placebo pills every 3rd pack 09/08/17  Yes Myles Lipps, MD  tretinoin (RETIN-A) 0.025 % cream Apply topically at bedtime.   Yes [provider]  fluticasone (FLONASE) 50 MCG/ACT nasal spray Place 1 spray into both nostrils daily. Patient not taking: Reported on 05/30/2018 08/07/17   Myles Lipps, MD    History reviewed. No pertinent past medical history.  History reviewed. No pertinent surgical history.  Social History   Tobacco Use  . Smoking status: Never Smoker  . Smokeless tobacco: Never Used  Substance Use Topics  . Alcohol use: Yes    Frequency: Never    Comment: occ    Family History  Problem Relation Age of Onset  . Hypertension Mother   . Diabetes Mother   . Stroke Mother   . Healthy Father   . Stroke Maternal Grandmother   . Diabetes  Maternal Grandfather     Review of Systems  Genitourinary: Negative for dysuria and hematuria.   Per hpi  OBJECTIVE:  Blood pressure 117/79, pulse 77, temperature 98.7 F (37.1 C), temperature source Oral, resp. rate 18, height 5\' 7"  (1.702 m), weight 249 lb (112.9 kg), last menstrual period 07/02/2018, SpO2 96 %. Body mass index is 39 kg/m.   Physical Exam Vitals signs and nursing note reviewed.  Constitutional:      Appearance: Normal appearance.  Abdominal:     Tenderness: There is no right CVA tenderness or left CVA tenderness.  Musculoskeletal:     Left hip: She exhibits tenderness (over GT, with mild TTP over gluteus tendons). She exhibits normal range of motion, no bony tenderness and no swelling.     Lumbar back: Normal.     Comments: Neg [place]  Neurological:     Mental Status: She is alert.       Results for orders placed or performed in visit on 07/23/18 (from the past 24 hour(s))  POCT urinalysis dipstick     Status: Abnormal   Collection Time: 07/23/18  1:42 PM  Result Value Ref Range   Color, UA yellow yellow   Clarity, UA clear clear   Glucose, UA negative negative mg/dL   Bilirubin, UA negative negative   Ketones, POC  UA negative negative mg/dL   Spec Grav, UA 6.659 9.357 - 1.025   Blood, UA negative negative   pH, UA 6.0 5.0 - 8.0   Protein Ur, POC negative negative mg/dL   Urobilinogen, UA 0.2 0.2 or 1.0 E.U./dL   Nitrite, UA Negative Negative   Leukocytes, UA Trace (A) Negative      ASSESSMENT and PLAN  1. Trochanteric bursitis of left hip Discussed supportive measures, new meds r/se/b and RTC precautions. Patient educational handout given.  2. Flank pain - POCT urinalysis dipstick  Other orders - ibuprofen (ADVIL,MOTRIN) 600 MG tablet; Take 1 tablet (600 mg total) by mouth every 8 (eight) hours as needed.    Return if symptoms worsen or fail to improve.    Myles Lipps, MD Primary Care at [facility] [address] [place],  [place] [address] Ph.  [phone] Fax [phone]

## 2018-07-23 NOTE — Patient Instructions (Signed)

## 2018-08-22 ENCOUNTER — Other Ambulatory Visit: Payer: Self-pay

## 2018-08-22 ENCOUNTER — Other Ambulatory Visit: Payer: Self-pay | Admitting: Family Medicine

## 2018-08-22 DIAGNOSIS — Z1329 Encounter for screening for other suspected endocrine disorder: Secondary | ICD-10-CM

## 2018-08-22 DIAGNOSIS — Z1389 Encounter for screening for other disorder: Secondary | ICD-10-CM

## 2018-08-22 DIAGNOSIS — Z13228 Encounter for screening for other metabolic disorders: Secondary | ICD-10-CM

## 2018-08-22 DIAGNOSIS — Z130 Encounter for screening for diseases of the blood and blood-forming organs and certain disorders involving the immune mechanism: Secondary | ICD-10-CM

## 2018-08-22 DIAGNOSIS — Z13220 Encounter for screening for lipoid disorders: Secondary | ICD-10-CM

## 2018-08-31 ENCOUNTER — Other Ambulatory Visit: Payer: Self-pay

## 2018-08-31 ENCOUNTER — Telehealth (INDEPENDENT_AMBULATORY_CARE_PROVIDER_SITE_OTHER): Payer: Managed Care, Other (non HMO) | Admitting: Family Medicine

## 2018-08-31 DIAGNOSIS — Z0000 Encounter for general adult medical examination without abnormal findings: Secondary | ICD-10-CM

## 2018-08-31 DIAGNOSIS — Z3041 Encounter for surveillance of contraceptive pills: Secondary | ICD-10-CM

## 2018-08-31 DIAGNOSIS — Z130 Encounter for screening for diseases of the blood and blood-forming organs and certain disorders involving the immune mechanism: Secondary | ICD-10-CM | POA: Diagnosis not present

## 2018-08-31 DIAGNOSIS — Z13228 Encounter for screening for other metabolic disorders: Secondary | ICD-10-CM

## 2018-08-31 DIAGNOSIS — Z13220 Encounter for screening for lipoid disorders: Secondary | ICD-10-CM

## 2018-08-31 DIAGNOSIS — Z1329 Encounter for screening for other suspected endocrine disorder: Secondary | ICD-10-CM

## 2018-08-31 DIAGNOSIS — Z23 Encounter for immunization: Secondary | ICD-10-CM

## 2018-08-31 MED ORDER — NORGESTIMATE-ETH ESTRADIOL 0.25-35 MG-MCG PO TABS: 1.0000 | ORAL_TABLET | Freq: Every day | ORAL | 3 refills | Status: DC

## 2018-08-31 NOTE — Progress Notes (Signed)
Virtual Visit via telephone Note  I connected with patient on 08/31/18 at 1129am by telephone and verified that I am speaking with the correct person using two identifiers. Sheryl Keller is currently located at home and patient is currently with her during visit. The provider, Myles Lipps, MD is located in their office at time of visit.  I discussed the limitations, risks, security and privacy concerns of performing an evaluation and management service by telephone and the availability of in person appointments. I also discussed with the patient that there may be a patient responsible charge related to this service. The patient expressed understanding and agreed to proceed.  Telephone visit today for CPE  HPI  Last CPE: Feb 2019? G&Ps: 0 Pap: Feb 2019, negative, denies any h/o abnormal paps STD: jan 2020 - negative BC : [facility], happy with method, needs refill, denies any skipping/missing pills Menses: regulated by [facility], sometimes heavy, clots, cramps, takes ibuprofen which helps, normal pelvic US in 2019 Mammogram: at age 30 FHX breast/ovarian cancer: denies FHx colon cancer: denies Exercise/diet: not exercising, has gained weight, used to go to gym regularly Sees dentist twice a year Does not go to the eye doctor, has no concerns, does not use glasses  Most Recent Immunizations  Administered Date(s) Administered  . HPV 9-valent 07/07/2017  . Influenza-Unspecified 02/20/2018  . Td 07/07/2017   Fall Risk  08/31/2018 07/23/2018 05/30/2018 12/01/2017 11/17/2017  Falls in the past year? 0 0 0 No No  Number falls in past yr: 0 0 - - -  Injury with Fall? 0 - - - -     Depression screen [facility] 2/9 08/31/2018 07/23/2018 05/30/2018  Decreased Interest 0 0 0  Down, Depressed, Hopeless 0 0 0  PHQ - 2 Score 0 0 0    No Known Allergies  Prior to Admission medications   Medication Sig Start Date End Date Taking? Authorizing Provider  clindamycin (CLEOCIN T) 1 % lotion APPLY TO AFFECTED AREA ONCE  A DAY IN MORNING 10/23/17   [provider]  ibuprofen (ADVIL,MOTRIN) 600 MG tablet Take 1 tablet (600 mg total) by mouth every 8 (eight) hours as needed. 07/23/18   Myles Lipps, MD  SPRINTEC 28 0.25-35 MG-MCG tablet TAKE 1 TABLET BY MOUTH DAILY. ONLY TAKE [place]ACEBO PILLS EVERY 3RD PACK 08/22/18   Myles Lipps, MD  tretinoin (RETIN-A) 0.025 % cream Apply topically at bedtime.    [provider]    No past medical history on file.  No past surgical history on file.  Social History   Tobacco Use  . Smoking status: Never Smoker  . Smokeless tobacco: Never Used  Substance Use Topics  . Alcohol use: Yes    Frequency: Never    Comment: occ    Family History  Problem Relation Age of Onset  . Hypertension Mother   . Diabetes Mother   . Stroke Mother   . Healthy Father   . Stroke Maternal Grandmother   . Diabetes Maternal Grandfather     ROS Per hpi  Objective  Vitals as reported by the patient: none  There were no vitals filed for this visit.  ASSESSMENT and PLAN  1. Annual physical exam No concerns per history or exam. Routine HCM labs ordered. HCM reviewed/discussed. Anticipatory guidance regarding healthy weight, lifestyle and choices given.   2. Encounter for surveillance of contraceptive pills Happy with method. Refilled for 1 year  3. Screening for deficiency anemia - CBC; Future  4.  Screening for thyroid disorder - TSH; Future  5. Screening for cholesterol level - Lipid panel; Future  6. Screening for metabolic disorder - Comprehensive metabolic panel; Future  7. Need for vaccination She is due for HPV #3, she can have it done when she comes in for fasting labs  Other orders - norgestimate-ethinyl estradiol (SPRINTEC 28) 0.25-35 MG-MCG tablet; Take 1 tablet by mouth daily.  FOLLOW-UP: 1 year   The above assessment and management plan was discussed with the patient. The patient verbalized understanding of and has agreed to the  management plan. Patient is aware to call the clinic if symptoms persist or worsen. Patient is aware when to return to the clinic for a follow-up visit. Patient educated on when it is appropriate to go to the emergency department.    I provided 30 minutes of non-face-to-face time during this encounter.  Myles LippsIrma M Santiago, MD Primary Care at [facility] [address] [place], [place] [address] Ph.  [phone] Fax [phone]

## 2018-08-31 NOTE — Progress Notes (Signed)
CPE

## 2018-09-07 ENCOUNTER — Ambulatory Visit: Payer: Managed Care, Other (non HMO)

## 2018-10-11 ENCOUNTER — Encounter: Payer: Self-pay | Admitting: Family Medicine

## 2018-10-19 ENCOUNTER — Ambulatory Visit: Payer: Managed Care, Other (non HMO) | Admitting: Family Medicine

## 2018-10-19 ENCOUNTER — Encounter: Payer: Self-pay | Admitting: Family Medicine

## 2018-10-19 ENCOUNTER — Other Ambulatory Visit: Payer: Self-pay

## 2018-10-19 VITALS — BP 121/80 | HR 84 | Temp 98.9°F | Ht 67.0 in | Wt 249.2 lb

## 2018-10-19 DIAGNOSIS — Z130 Encounter for screening for diseases of the blood and blood-forming organs and certain disorders involving the immune mechanism: Secondary | ICD-10-CM

## 2018-10-19 DIAGNOSIS — M25552 Pain in left hip: Secondary | ICD-10-CM | POA: Diagnosis not present

## 2018-10-19 DIAGNOSIS — Z1329 Encounter for screening for other suspected endocrine disorder: Secondary | ICD-10-CM

## 2018-10-19 DIAGNOSIS — Z13228 Encounter for screening for other metabolic disorders: Secondary | ICD-10-CM | POA: Diagnosis not present

## 2018-10-19 DIAGNOSIS — Z13220 Encounter for screening for lipoid disorders: Secondary | ICD-10-CM

## 2018-10-19 LAB — POCT URINALYSIS DIP (MANUAL ENTRY)
Bilirubin, UA: NEGATIVE
Blood, UA: NEGATIVE
Glucose, UA: NEGATIVE mg/dL
Ketones, POC UA: NEGATIVE mg/dL
Nitrite, UA: NEGATIVE
Protein Ur, POC: NEGATIVE mg/dL
Spec Grav, UA: 1.02 (ref 1.010–1.025)
Urobilinogen, UA: 0.2 E.U./dL
pH, UA: 7 (ref 5.0–8.0)

## 2018-10-19 LAB — LIPID PANEL

## 2018-10-19 NOTE — Patient Instructions (Signed)
° ° ° °  If you have lab work done today you will be contacted with your lab results within the next 2 weeks.  If you have not heard from us then please contact us. The fastest way to get your results is to register for My Chart. ° ° °IF you received an x-ray today, you will receive an invoice from [facility]. Please contact [facility] at [phone] with questions or concerns regarding your invoice.  ° °IF you received labwork today, you will receive an invoice from [facility]. Please contact [facility] at [phone] with questions or concerns regarding your invoice.  ° °Our billing staff will not be able to assist you with questions regarding bills from these companies. ° °You will be contacted with the lab results as soon as they are available. The fastest way to get your results is to activate your My Chart account. Instructions are located on the last page of this paperwork. If you have not heard from us regarding the results in 2 weeks, please contact this office. °  ° ° ° °

## 2018-10-19 NOTE — Progress Notes (Signed)
5/29/202011:13 AM  Sheryl Keller October 23, 1990, 28 y.o., female [id number]  Chief Complaint  Patient presents with  . Pain    reoccuring pain in both hips, happens when she is excerising. Pain is sharp going into both side of buttucks. Using elevation for the pain and taking 600mg  advil pain scale an 8.    HPI:   Patient is a 28 y.o. female who presents today for recurring bilateral hip pain  Seen in March for similar issues Had resolved because she stopped exercising She tried to resume exercising and pain returned, L > R Not hurting now as she stopped exercising again Reports pain anterior hip, sharp shooting pain Radiates to her back at times No numbness or tingling She runs for exercise  Had CPE via telemedicine Did not come in for labs so doing them today  Fall Risk  10/19/2018 08/31/2018 07/23/2018 05/30/2018 12/01/2017  Falls in the past year? 0 0 0 0 No  Number falls in past yr: 0 0 0 - -  Injury with Fall? 0 0 - - -     Depression screen [facility] 2/9 10/19/2018 08/31/2018 07/23/2018  Decreased Interest 0 0 0  Down, Depressed, Hopeless 0 0 0  PHQ - 2 Score 0 0 0    No Known Allergies  Prior to Admission medications   Medication Sig Start Date End Date Taking? Authorizing Provider  ibuprofen (ADVIL,MOTRIN) 600 MG tablet Take 1 tablet (600 mg total) by mouth every 8 (eight) hours as needed. 07/23/18  Yes Myles Lipps, MD  norgestimate-ethinyl estradiol (SPRINTEC 28) 0.25-35 MG-MCG tablet Take 1 tablet by mouth daily. 08/31/18  Yes Myles Lipps, MD    History reviewed. No pertinent past medical history.  History reviewed. No pertinent surgical history.  Social History   Tobacco Use  . Smoking status: Never Smoker  . Smokeless tobacco: Never Used  Substance Use Topics  . Alcohol use: Yes    Frequency: Never    Comment: occ    Family History  Problem Relation Age of Onset  . Hypertension Mother   . Diabetes Mother   . Stroke Mother   . Healthy Father   .  Stroke Maternal Grandmother   . Diabetes Maternal Grandfather     ROS Per hpi  OBJECTIVE:  Today's Vitals   10/19/18 1033  BP: 121/80  Pulse: 84  Temp: 98.9 F (37.2 C)  TempSrc: Oral  SpO2: 97%  Weight: 249 lb 3.2 oz (113 kg)  Height: 5\' 7"  (1.702 m)   Body mass index is 39.03 kg/m.   Physical Exam Vitals signs and nursing note reviewed.  Constitutional:      Appearance: She is well-developed.  HENT:     Head: Normocephalic and atraumatic.  Eyes:     General: No scleral icterus.    Conjunctiva/sclera: Conjunctivae normal.     Pupils: Pupils are equal, round, and reactive to light.  Neck:     Musculoskeletal: Neck supple.  Pulmonary:     Effort: Pulmonary effort is normal.  Musculoskeletal:     Right hip: Normal. She exhibits normal range of motion, normal strength, no tenderness and no bony tenderness.     Left hip: Normal. She exhibits normal range of motion, normal strength, no tenderness and no bony tenderness.  Skin:    General: Skin is warm and dry.  Neurological:     Mental Status: She is alert and oriented to person, place, and time.     ASSESSMENT and PLAN  1. Pain of left hip joint Exam unremarkable. Referring to sports medicine for further eval and treatment. - Ambulatory referral to Sports Medicine  2. Screening for thyroid disorder - TSH  3. Screening for metabolic disorder - Comprehensive metabolic panel  4. Screening for deficiency anemia - CBC  5. Screening for cholesterol level - Lipid panel  Return if symptoms worsen or fail to improve.    Myles LippsIrma M Santiago, MD Primary Care at [facility] [address] [place], [place] [address] Ph.  [phone] Fax [phone]

## 2018-10-20 LAB — LIPID PANEL
Chol/HDL Ratio: 3.3 ratio (ref 0.0–4.4)
Cholesterol, Total: 197 mg/dL (ref 100–199)
HDL: 59 mg/dL (ref >39)
LDL Calculated: 124 mg/dL — ABNORMAL HIGH (ref 0–99)
Triglycerides: 70 mg/dL (ref 0–149)
VLDL Cholesterol Cal: 14 mg/dL (ref 5–40)

## 2018-10-20 LAB — CBC
Hematocrit: 36.1 % (ref 34.0–46.6)
Hemoglobin: 11.9 g/dL (ref 11.1–15.9)
MCH: 27.3 pg (ref 26.6–33.0)
MCHC: 33 g/dL (ref 31.5–35.7)
MCV: 83 fL (ref 79–97)
Platelets: 308 10*3/uL (ref 150–450)
RBC: 4.36 x10E6/uL (ref 3.77–5.28)
RDW: 14.9 % (ref 11.7–15.4)
WBC: 5.7 10*3/uL (ref 3.4–10.8)

## 2018-10-20 LAB — COMPREHENSIVE METABOLIC PANEL
ALT: 11 IU/L (ref 0–32)
AST: 12 IU/L (ref 0–40)
Albumin/Globulin Ratio: 1.4 (ref 1.2–2.2)
Albumin: 4 g/dL (ref 3.9–5.0)
Alkaline Phosphatase: 47 IU/L (ref 39–117)
BUN/Creatinine Ratio: 10 (ref 9–23)
BUN: 9 mg/dL (ref 6–20)
Bilirubin Total: 0.2 mg/dL (ref 0.0–1.2)
CO2: 23 mmol/L (ref 20–29)
Calcium: 9.3 mg/dL (ref 8.7–10.2)
Chloride: 101 mmol/L (ref 96–106)
Creatinine, Ser: 0.86 mg/dL (ref 0.57–1.00)
GFR calc Af Amer: 109 mL/min/{1.73_m2} (ref >59)
GFR calc non Af Amer: 94 mL/min/{1.73_m2} (ref >59)
Globulin, Total: 2.8 g/dL (ref 1.5–4.5)
Glucose: 87 mg/dL (ref 65–99)
Potassium: 4.5 mmol/L (ref 3.5–5.2)
Sodium: 138 mmol/L (ref 134–144)
Total Protein: 6.8 g/dL (ref 6.0–8.5)

## 2018-10-20 LAB — TSH: TSH: 1.39 u[IU]/mL (ref 0.450–4.500)

## 2018-10-25 ENCOUNTER — Ambulatory Visit (INDEPENDENT_AMBULATORY_CARE_PROVIDER_SITE_OTHER): Payer: Managed Care, Other (non HMO) | Admitting: Sports Medicine

## 2018-10-25 ENCOUNTER — Other Ambulatory Visit: Payer: Self-pay

## 2018-10-25 ENCOUNTER — Encounter: Payer: Self-pay | Admitting: Sports Medicine

## 2018-10-25 VITALS — BP 122/80 | Ht 66.0 in | Wt 248.0 lb

## 2018-10-25 DIAGNOSIS — M25552 Pain in left hip: Secondary | ICD-10-CM | POA: Diagnosis not present

## 2018-10-25 NOTE — Progress Notes (Signed)
  Sheryl Keller - 28 y.o. female MRN [id number]  Date of birth: March 06, 1991    SUBJECTIVE:      Chief Complaint: Left hip pain  HPI:  28 year old female with left hip pain for the past 4 months.  She denies any specific injury.  She localizes her pain to the lateral hip.  No radiation of her pain down the leg.  No groin pain.  She began to notice pain with activity, mainly running.  She does however also experience pain with range of movement such as twisting.  Pain tends to be short-lived lasting a few seconds before it dissipates.  She saw her PCP who recommended rest and elevation.  She is also been taking ibuprofen which is somewhat helpful.  No bruising. she denies any numbness or tingling.  No notable weakness.  No skin changes.   ROS:     See HPI. All other reviewed systems negative.  PERTINENT  PMH / PSH FH / / SH:  Past Medical, Surgical, Social, and Family History Reviewed & Updated in the EMR.  Pertinent findings include:  Obesity  OBJECTIVE: BP 122/80   Ht 5\' 6"  (1.676 m)   Wt 248 lb (112.5 kg)   BMI 40.03 kg/m   Physical Exam:  Vital signs are reviewed.  GEN: Alert and oriented, NAD Pulm: Breathing unlabored PSY: normal mood, congruent affect  MSK: Left hip:  - Inspection: No gross deformity, no swelling, erythema, or ecchymosis - Palpation: Tenderness over the greater trochanter and gluteus medius muscle belly - ROM: Normal range of motion on Flexion abduction, internal and external rotation.  No pain with passive IR/ER - Strength: 4+/5 strength with hip abduction - Neuro/vasc: NV intact distally - Special Tests: Negative FABER and FADIR.  Positive Trendelenberg.   Right hip:  - Inspection: No gross deformity, no swelling, erythema, or ecchymosis - Palpation: very mild tenderness over the greater trochanter  - ROM: Normal range of motion on Flexion abduction, internal and external rotation.  No pain with passive IR/ER - Strength: 4+/5 strength with hip  abduction - Neuro/vasc: NV intact distally - Special Tests: Negative FABER and FADIR.  Positive Trendelenberg.     ASSESSMENT & PLAN:  1.  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 - Home rehab exercises - OTC NSAIDs as needed - activity as tolerated -Follow-up in 4 weeks  Patient seen and evaluated with the sports medicine fellow.  I agree with the above plan of care.  Hip strengthening exercises to be done in the form of a home exercise program.  Consider formal physical therapy if symptoms persist.  I do not see the need for any sort of imaging at this time.  Follow-up in 4 weeks for reevaluation.

## 2018-10-25 NOTE — Patient Instructions (Addendum)
Your hip pain is likely secondary to a mild tendinopathy of your gluteus medius  Do the home exercises daily Consider using ice after the rehab exercises if it is bothersome Avoid painful activities such as running for about the next 2 weeks, then may gradually increase activity as tolerated We will have you follow-up in 4 weeks if your pain is persistent.  Will consider formal physical therapy at that time.

## 2018-11-22 ENCOUNTER — Ambulatory Visit: Payer: Managed Care, Other (non HMO) | Admitting: Sports Medicine

## 2018-11-29 ENCOUNTER — Ambulatory Visit: Payer: Managed Care, Other (non HMO) | Admitting: Sports Medicine

## 2019-02-01 ENCOUNTER — Other Ambulatory Visit: Payer: Self-pay | Admitting: *Deleted

## 2019-02-01 DIAGNOSIS — Z20822 Contact with and (suspected) exposure to covid-19: Secondary | ICD-10-CM

## 2019-02-03 LAB — NOVEL CORONAVIRUS, NAA: SARS-CoV-2, NAA: NOT DETECTED

## 2019-04-19 ENCOUNTER — Other Ambulatory Visit: Payer: Self-pay

## 2019-04-19 ENCOUNTER — Ambulatory Visit: Payer: Managed Care, Other (non HMO) | Admitting: Family Medicine

## 2019-04-19 ENCOUNTER — Encounter: Payer: Self-pay | Admitting: Family Medicine

## 2019-04-19 ENCOUNTER — Other Ambulatory Visit (HOSPITAL_COMMUNITY)
Admission: RE | Admit: 2019-04-19 | Discharge: 2019-04-19 | Disposition: A | Discharge status: Home / Self Care | Payer: Managed Care, Other (non HMO) | Source: Ambulatory Visit | Attending: Family Medicine | Admitting: Family Medicine

## 2019-04-19 VITALS — BP 128/86 | HR 82 | Temp 98.3°F | Wt 265.8 lb

## 2019-04-19 DIAGNOSIS — Z113 Encounter for screening for infections with a predominantly sexual mode of transmission: Secondary | ICD-10-CM

## 2019-04-19 DIAGNOSIS — Z23 Encounter for immunization: Secondary | ICD-10-CM

## 2019-04-19 NOTE — Progress Notes (Signed)
Subjective:  Patient ID: Sheryl Keller, female    DOB: Oct 15, 1990  Age: 28 y.o. MRN: [id number]  CC:  Chief Complaint  Patient presents with  . Exposure to STD    STD check today    HPI Ladoris Lythgoe presents for   Here for STI testing. New contact 2 days ago - unprotected. Same partner 1 week ago - also unprotected.  On sprintec for contraception. No prior STI.  No new vaginal discharge, rash, no flu like symptoms.  No abd pain/pelvic pain.   History Patient Active Problem List   Diagnosis Date Noted  . Menorrhagia with regular cycle 08/07/2017   No past medical history on file. No past surgical history on file. No Known Allergies Prior to Admission medications   Medication Sig Start Date End Date Taking? Authorizing Provider  norgestimate-ethinyl estradiol (SPRINTEC 28) 0.25-35 MG-MCG tablet Take 1 tablet by mouth daily. 08/31/18   Myles Lipps, MD   Social History   Socioeconomic History  . Marital status: Single    Spouse name: Not on file  . Number of children: 0  . Years of education: Not on file  . Highest education level: Not on file  Occupational History  . Not on file  Social Needs  . Financial resource strain: Not on file  . Food insecurity    Worry: Not on file    Inability: Not on file  . Transportation needs    Medical: Not on file    Non-medical: Not on file  Tobacco Use  . Smoking status: Never Smoker  . Smokeless tobacco: Never Used  Substance and Sexual Activity  . Alcohol use: Yes    Frequency: Never    Comment: occ  . Drug use: No  . Sexual activity: Yes    Birth control/protection: Pill  Lifestyle  . Physical activity    Days per week: Not on file    Minutes per session: Not on file  . Stress: Not on file  Relationships  . Social Musician on phone: Not on file    Gets together: Not on file    Attends religious service: Not on file    Active member of club or organization: Not on file    Attends meetings of  clubs or organizations: Not on file    Relationship status: Not on file  . Intimate partner violence    Fear of current or ex partner: Not on file    Emotionally abused: Not on file    Physically abused: Not on file    Forced sexual activity: Not on file  Other Topics Concern  . Not on file  Social History Narrative  . Not on file    Review of Systems  Per HPI.  Objective:   Vitals:   04/19/19 1142  BP: 128/86  Pulse: 82  Temp: 98.3 F (36.8 C)  TempSrc: Oral  SpO2: 97%  Weight: 265 lb 12.8 oz (120.6 kg)     Physical Exam Constitutional:      General: She is not in acute distress.    Appearance: She is well-developed.  HENT:     Head: Normocephalic and atraumatic.  Cardiovascular:     Rate and Rhythm: Normal rate.  Pulmonary:     Effort: Pulmonary effort is normal.  Abdominal:     Tenderness: There is no abdominal tenderness. There is no right CVA tenderness or left CVA tenderness.  Neurological:     Mental Status: She  is alert and oriented to person, place, and time.  Psychiatric:        Mood and Affect: Mood normal.        Behavior: Behavior normal.        Assessment & Plan:  Sheryl Keller is a 28 y.o. female . Routine screening for STI (sexually transmitted infection) - Plan: HIV antibody (with reflex), RPR, Urine cytology ancillary only  -Asymptomatic.  Safer sex practices discussed.  -STI screening as above with HIV, RPR, chlamydia, gonorrhea, trichomonas.  Need for prophylactic vaccination and inoculation against influenza - Plan: Flu Vaccine QUAD 6+ mos PF IM (Fluarix Quad PF)   No orders of the defined types were placed in this encounter.  Patient Instructions       If you have lab work done today you will be contacted with your lab results within the next 2 weeks.  If you have not heard from [place] then please contact us. The fastest way to get your results is to register for My Chart.   IF you received an x-ray today, you will receive an  invoice from [facility]. Please contact [facility] at [phone] with questions or concerns regarding your invoice.   IF you received labwork today, you will receive an invoice from [place]. Please contact [facility] at [phone] with questions or concerns regarding your invoice.   Our billing staff will not be able to assist you with questions regarding bills from these companies.  You will be contacted with the lab results as soon as they are available. The fastest way to get your results is to activate your My Chart account. Instructions are located on the last page of this paperwork. If you have not heard from [place] regarding the results in 2 weeks, please contact this office.          Signed, Merri Ray, MD [facility]

## 2019-04-19 NOTE — Patient Instructions (Addendum)
   I will let you know when results have returned. Condoms are recommended to lessen risk of STI's. Please let me know if there are questions.     If you have lab work done today you will be contacted with your lab results within the next 2 weeks.  If you have not heard from [place] then please contact us. The fastest way to get your results is to register for My Chart.   IF you received an x-ray today, you will receive an invoice from [facility]. Please contact [facility] at [phone] with questions or concerns regarding your invoice.   IF you received labwork today, you will receive an invoice from [place]. Please contact [facility] at [phone] with questions or concerns regarding your invoice.   Our billing staff will not be able to assist you with questions regarding bills from these companies.  You will be contacted with the lab results as soon as they are available. The fastest way to get your results is to activate your My Chart account. Instructions are located on the last page of this paperwork. If you have not heard from [place] regarding the results in 2 weeks, please contact this office.

## 2019-04-20 LAB — HIV ANTIBODY (ROUTINE TESTING W REFLEX): HIV Screen 4th Generation wRfx: NONREACTIVE

## 2019-04-20 LAB — RPR: RPR Ser Ql: NONREACTIVE

## 2019-04-23 ENCOUNTER — Telehealth: Payer: Self-pay

## 2019-04-23 ENCOUNTER — Telehealth: Payer: Self-pay | Admitting: Family Medicine

## 2019-04-23 LAB — URINE CYTOLOGY ANCILLARY ONLY
Chlamydia: NEGATIVE
Comment: NEGATIVE
Comment: NEGATIVE
Comment: NORMAL
Neisseria Gonorrhea: NEGATIVE
Trichomonas: NEGATIVE

## 2019-04-23 NOTE — Telephone Encounter (Signed)
Pt called again requesting to speak with someone that can read her lab results. Pt not understanding that provider needs to review first

## 2019-04-23 NOTE — Telephone Encounter (Signed)
Pt called for her lab results. Please advise. Pt would like her results as soon as possible. Not willing to wait 2 weeks.

## 2019-04-23 NOTE — Telephone Encounter (Signed)
Spoke with pt, results given via mychart. She is aware of the urine test order per Carlota Raspberry. No questions or concern at this time

## 2019-04-24 NOTE — Telephone Encounter (Signed)
Labs results have been given to pt yesterday.

## 2019-07-16 ENCOUNTER — Ambulatory Visit: Payer: Managed Care, Other (non HMO)

## 2019-07-18 ENCOUNTER — Emergency Department (HOSPITAL_COMMUNITY)
Admission: EM | Admit: 2019-07-18 | Discharge: 2019-07-18 | Disposition: A | Discharge status: Home / Self Care | Payer: Managed Care, Other (non HMO) | Attending: Emergency Medicine | Admitting: Emergency Medicine

## 2019-07-18 ENCOUNTER — Other Ambulatory Visit: Payer: Self-pay

## 2019-07-18 ENCOUNTER — Encounter (HOSPITAL_COMMUNITY): Payer: Self-pay | Admitting: Emergency Medicine

## 2019-07-18 ENCOUNTER — Emergency Department (HOSPITAL_COMMUNITY): Payer: Managed Care, Other (non HMO)

## 2019-07-18 DIAGNOSIS — Z793 Long term (current) use of hormonal contraceptives: Secondary | ICD-10-CM | POA: Insufficient documentation

## 2019-07-18 DIAGNOSIS — J029 Acute pharyngitis, unspecified: Secondary | ICD-10-CM | POA: Diagnosis present

## 2019-07-18 DIAGNOSIS — J039 Acute tonsillitis, unspecified: Secondary | ICD-10-CM

## 2019-07-18 LAB — CBC WITH DIFFERENTIAL/PLATELET
Abs Immature Granulocytes: 0.03 10*3/uL (ref 0.00–0.07)
Basophils Absolute: 0 10*3/uL (ref 0.0–0.1)
Basophils Relative: 0 %
Eosinophils Absolute: 0 10*3/uL (ref 0.0–0.5)
Eosinophils Relative: 0 %
HCT: 37.8 % (ref 36.0–46.0)
Hemoglobin: 12 g/dL (ref 12.0–15.0)
Immature Granulocytes: 0 %
Lymphocytes Relative: 15 %
Lymphs Abs: 1.5 10*3/uL (ref 0.7–4.0)
MCH: 25.3 pg — ABNORMAL LOW (ref 26.0–34.0)
MCHC: 31.7 g/dL (ref 30.0–36.0)
MCV: 79.7 fL — ABNORMAL LOW (ref 80.0–100.0)
Monocytes Absolute: 0.6 10*3/uL (ref 0.1–1.0)
Monocytes Relative: 6 %
Neutro Abs: 8.2 10*3/uL — ABNORMAL HIGH (ref 1.7–7.7)
Neutrophils Relative %: 79 %
Platelets: 322 10*3/uL (ref 150–400)
RBC: 4.74 MIL/uL (ref 3.87–5.11)
RDW: 17 % — ABNORMAL HIGH (ref 11.5–15.5)
WBC: 10.4 10*3/uL (ref 4.0–10.5)
nRBC: 0 % (ref 0.0–0.2)

## 2019-07-18 LAB — I-STAT BETA HCG BLOOD, ED (MC, WL, AP ONLY): I-stat hCG, quantitative: <5 m[IU]/mL (ref <5)

## 2019-07-18 LAB — BASIC METABOLIC PANEL
Anion gap: 8 (ref 5–15)
BUN: 7 mg/dL (ref 6–20)
CO2: 25 mmol/L (ref 22–32)
Calcium: 8.8 mg/dL — ABNORMAL LOW (ref 8.9–10.3)
Chloride: 103 mmol/L (ref 98–111)
Creatinine, Ser: 0.71 mg/dL (ref 0.44–1.00)
GFR calc Af Amer: >60 mL/min (ref >60)
GFR calc non Af Amer: >60 mL/min (ref >60)
Glucose, Bld: 96 mg/dL (ref 70–99)
Potassium: 4.1 mmol/L (ref 3.5–5.1)
Sodium: 136 mmol/L (ref 135–145)

## 2019-07-18 LAB — GROUP A STREP BY PCR: Group A Strep by PCR: NOT DETECTED

## 2019-07-18 MED ORDER — CLINDAMYCIN PHOSPHATE 600 MG/50ML IV SOLN
600.0000 mg | Freq: Once | INTRAVENOUS | Status: AC
  Administered 2019-07-18: 600 mg via INTRAVENOUS
  Filled 2019-07-18: qty 50

## 2019-07-18 MED ORDER — KETOROLAC TROMETHAMINE 30 MG/ML IJ SOLN
30.0000 mg | Freq: Once | INTRAMUSCULAR | Status: AC
  Administered 2019-07-18: 09:00:00 30 mg via INTRAVENOUS
  Filled 2019-07-18: qty 1

## 2019-07-18 MED ORDER — IBUPROFEN 800 MG PO TABS: 800.0000 mg | ORAL_TABLET | Freq: Three times a day (TID) | ORAL | 0 refills | Status: AC

## 2019-07-18 MED ORDER — LIDOCAINE VISCOUS HCL 2 % MT SOLN
15.0000 mL | Freq: Once | OROMUCOSAL | Status: AC
  Administered 2019-07-18: 09:00:00 15 mL via OROMUCOSAL
  Filled 2019-07-18: qty 15

## 2019-07-18 MED ORDER — CLINDAMYCIN HCL 300 MG PO CAPS: 300.0000 mg | ORAL_CAPSULE | Freq: Three times a day (TID) | ORAL | 0 refills | Status: AC

## 2019-07-18 MED ORDER — IOHEXOL 300 MG/ML  SOLN
75.0000 mL | Freq: Once | INTRAMUSCULAR | Status: AC | PRN
  Administered 2019-07-18: 75 mL via INTRAVENOUS

## 2019-07-18 MED ORDER — DEXAMETHASONE SODIUM PHOSPHATE 10 MG/ML IJ SOLN
10.0000 mg | Freq: Once | INTRAMUSCULAR | Status: AC
  Administered 2019-07-18: 10 mg via INTRAVENOUS
  Filled 2019-07-18: qty 1

## 2019-07-18 MED ORDER — SODIUM CHLORIDE 0.9 % IV BOLUS
1000.0000 mL | Freq: Once | INTRAVENOUS | Status: AC
  Administered 2019-07-18: 09:00:00 1000 mL via INTRAVENOUS

## 2019-07-18 MED ORDER — SODIUM CHLORIDE (PF) 0.9 % IJ SOLN
INTRAMUSCULAR | Status: AC
  Filled 2019-07-18: qty 50

## 2019-07-18 MED ORDER — ACETAMINOPHEN 500 MG PO TABS
1000.0000 mg | ORAL_TABLET | Freq: Once | ORAL | Status: AC
  Administered 2019-07-18: 1000 mg via ORAL
  Filled 2019-07-18: qty 2

## 2019-07-18 NOTE — Discharge Instructions (Signed)
Scan shows tonsillitis without an abscess.  Please take clindamycin to treat for any bacterial throat infection.  Take ibuprofen 800 mg and Tylenol 1000 mg every 8 hours to treat pain.  Take these with food on your stomach.  You can also use over-the-counter's Cepacol throat lozenges to help with sore throat.  You focus on staying hydrated and measure sore throat improved you can return to your normal diet.  If symptoms or not improving follow-up with your PCP or with ENT.  If you have worsening sore throat, difficulty breathing or swallowing or any other new or concerning symptoms return to the emergency department.

## 2019-07-18 NOTE — ED Provider Notes (Signed)
Sheryl Keller Provider Note   CSN: [id number] Arrival date & time: 07/18/19  3762     History Chief Complaint  Patient presents with  . Sore Throat  . Otalgia    right    Sheryl Keller is a 29 y.o. female.  Sheryl Keller is a 29 y.o. female who is otherwise healthy, presents to the emergency department for worsening sore throat.  [facility], had negative strep test, and Covid test, also had gonorrhea testing done, which she has not received the results of, and was given a shot of antibiotics, she is not sure what antibiotic she received.  She states that despite that antibiotic she is continued to have worsening sore throat, pain is now significantly worse on the right with some swelling and pain in the neck as well.  She reports it is very difficult to swallow even her own saliva and she has not been able to eat and drink well.  She reports pain is now radiating back towards her right ear.  She has had some chills and low-grade fevers.  She denies any associated rhinorrhea or cough.  No chest pain or shortness of breath.  No nausea, vomiting, abdominal pain or diarrhea.  She does state that she is sexually active and had oral sex with her partner a few days before this began, she is not having any vaginal discharge or dysuria and does not think she has been exposed to an STD.  The history is provided by the patient.       History reviewed. No pertinent past medical history.  Patient Active Problem List   Diagnosis Date Noted  . Menorrhagia with regular cycle 08/07/2017    History reviewed. No pertinent surgical history.   OB History   No obstetric history on file.     Family History  Problem Relation Age of Onset  . Hypertension Mother   . Diabetes Mother   . Stroke Mother   . Healthy Father   . Stroke Maternal Grandmother   . Diabetes Maternal Grandfather     Social History    Tobacco Use  . Smoking status: Never Smoker  . Smokeless tobacco: Never Used  Substance Use Topics  . Alcohol use: Yes    Comment: occ  . Drug use: No    Home Medications Prior to Admission medications   Medication Sig Start Date End Date Taking? Authorizing Provider  clindamycin (CLEOCIN) 300 MG capsule Take 1 capsule (300 mg total) by mouth 3 (three) times daily for 7 days. X 7 days 07/18/19 07/25/19  Dartha Lodge, PA-C  ibuprofen (ADVIL) 800 MG tablet Take 1 tablet (800 mg total) by mouth 3 (three) times daily. 07/18/19   Dartha Lodge, PA-C  norgestimate-ethinyl estradiol (SPRINTEC 28) 0.25-35 MG-MCG tablet Take 1 tablet by mouth daily. 08/31/18   Myles Lipps, MD    Allergies    Patient has no known allergies.  Review of Systems   Review of Systems  Constitutional: Positive for chills and fever.  HENT: Positive for sore throat and trouble swallowing. Negative for congestion and rhinorrhea.   Respiratory: Negative for cough, shortness of breath and stridor.   Cardiovascular: Negative for chest pain.  Gastrointestinal: Negative for nausea and vomiting.  Musculoskeletal: Positive for neck pain.  Neurological: Positive for headaches.  All other systems reviewed and are negative.   Physical Exam Updated Vital Signs BP (!) 145/86  Pulse (!) 114   Temp 100.1 F (37.8 C) (Oral)   Resp 19   LMP 07/04/2019   SpO2 99%   Physical Exam Vitals and nursing note reviewed.  Constitutional:      General: She is not in acute distress.    Appearance: She is well-developed. She is not ill-appearing or diaphoretic.  HENT:     Head: Normocephalic and atraumatic.     Mouth/Throat:     Mouth: Mucous membranes are moist.     Pharynx: Uvula midline. Pharyngeal swelling, oropharyngeal exudate and posterior oropharyngeal erythema present.     Tonsils: Tonsillar exudate present. 3+ on the right. 2+ on the left.     Comments: Oropharynx is erythematous with bilateral tonsillar  edema, worse on the right and left, uvula is midline, there are exudates noted specifically on the right tonsil with peritonsillar swelling.  Uvula is midline with minimal swelling.  Mild trismus, patient with difficulty swallowing secretions Eyes:     General:        Right eye: No discharge.        Left eye: No discharge.     Pupils: Pupils are equal, round, and reactive to light.  Neck:     Comments: Tenderness over the right neck, palpable lymphadenopathy, no stridor, range of motion intact Cardiovascular:     Rate and Rhythm: Regular rhythm. Tachycardia present.     Heart sounds: Normal heart sounds.     Comments: Tachycardia with regular rhythm Pulmonary:     Effort: Pulmonary effort is normal. No respiratory distress.     Breath sounds: Normal breath sounds. No wheezing or rales.     Comments: Respirations equal and unlabored, patient able to speak in full sentences, lungs clear to auscultation bilaterally Musculoskeletal:        General: No deformity.     Cervical back: Neck supple.  Lymphadenopathy:     Cervical: Cervical adenopathy present.  Skin:    General: Skin is warm and dry.     Capillary Refill: Capillary refill takes less than 2 seconds.  Neurological:     Mental Status: She is alert.     Coordination: Coordination normal.     Comments: Speech is clear, able to follow commands Moves extremities without ataxia, coordination intact  Psychiatric:        Mood and Affect: Mood normal.        Behavior: Behavior normal.     ED Results / Procedures / Treatments   Labs (all labs ordered are listed, but only abnormal results are displayed) Labs Reviewed  BASIC METABOLIC PANEL - Abnormal; Notable for the following components:      Result Value   Calcium 8.8 (*)    All other components within normal limits  CBC WITH DIFFERENTIAL/PLATELET - Abnormal; Notable for the following components:   MCV 79.7 (*)    MCH 25.3 (*)    RDW 17.0 (*)    Neutro Abs 8.2 (*)    All  other components within normal limits  GROUP A STREP BY PCR  I-STAT BETA HCG BLOOD, ED ([facility], [facility], [facility] ONLY)    EKG None  Radiology CT Soft Tissue Neck W Contrast  Result Date: 07/18/2019 CLINICAL DATA:  Sore throat difficulty swallowing. Recent antibiotics. EXAM: CT NECK WITH CONTRAST TECHNIQUE: Multidetector CT imaging of the neck was performed using the standard protocol following the bolus administration of intravenous contrast. CONTRAST:  78mL OMNIPAQUE IOHEXOL 300 MG/ML  SOLN COMPARISON:  None. FINDINGS: Pharynx and  larynx: Asymmetric enlargement of the right tonsil without abscess or mass. Mild enlargement left tonsil. Edema in the soft palate and uvula. Negative larynx. Airway intact. Epiglottis not swollen. Salivary glands: No inflammation, mass, or stone. Thyroid: Negative Lymph nodes: Right level 2 lymph node 16 mm. Additional 1 cm level 2 lymph node. Subcentimeter right level 5 lymph nodes Subcentimeter level 2 and level 5 lymph nodes on the left. No necrotic nodes. Vascular: Normal vascular enhancement. Limited intracranial: Negative Visualized orbits: Negative Mastoids and visualized paranasal sinuses: Negative Skeleton: Negative Upper chest: Negative Other: None IMPRESSION: Asymmetric enlargement right tonsil without abscess. Mild enlargement left tonsil. Edema in the soft palate and uvula. Mild reactive lymph nodes in the neck bilaterally with 16 mm lymph node right level 2 station. Findings most consistent with pharyngitis/tonsillitis without abscess. Electronically Signed   By: Marlan Palau M.D.   On: 07/18/2019 10:34    Procedures Procedures (including critical care time)  Medications Ordered in ED Medications  sodium chloride (PF) 0.9 % injection (has no administration in time range)  acetaminophen (TYLENOL) tablet 1,000 mg (has no administration in time range)  sodium chloride 0.9 % bolus 1,000 mL (0 mLs Intravenous Stopped 07/18/19 1123)  ketorolac (TORADOL) 30 MG/ML  injection 30 mg (30 mg Intravenous Given 07/18/19 0901)  dexamethasone (DECADRON) injection 10 mg (10 mg Intravenous Given 07/18/19 0901)  lidocaine (XYLOCAINE) 2 % viscous mouth solution 15 mL (15 mLs Mouth/Throat Given 07/18/19 0901)  clindamycin (CLEOCIN) IVPB 600 mg (0 mg Intravenous Stopped 07/18/19 0930)  iohexol (OMNIPAQUE) 300 MG/ML solution 75 mL (75 mLs Intravenous Contrast Given 07/18/19 1011)    ED Course  I have reviewed the triage vital signs and the nursing notes.  Pertinent labs & imaging results that were available during my care of the patient were reviewed by me and considered in my medical decision making (see chart for details).    MDM Rules/Calculators/A&P                     29 year old throat since Monday, after being seen and treated at urgent care, on arrival she has a low-grade fever and is tachycardic, he is not in any acute distress.  She does have significant swelling of the right tonsil and peritonsillar area with some exudates noted, uvula is midline and there is still some concern for possible peritonsillar abscess.  Will get basic labs, repeat strep testing, and CT soft tissue neck to evaluate for potential peritonsillar or retropharyngeal abscess.  IV fluids, Toradol, Decadron, clindamycin, and viscous lidocaine given for symptom management.  Lab work is overall reassuring there is no leukocytosis, normal hemoglobin, no significant electrolyte derangements, negative pregnancy negative strep PCR.  CT consistent with asymmetric enlargement of the right tonsil without abscess consistent with tonsillitis, there is some surrounding reactive lymphadenopathy.  On reevaluation patient reports improvement in her symptoms, she is able to swallow now and reports pain is significantly better.  She still has some soreness in her throat.  I discussed reassuring CT scan, no evidence of abscess that would require drainage.  This could be viral versus bacterial tonsil infection.   Will treat with NSAIDs, Tylenol, clindamycin, and's epical throat lozenges, will have patient follow-up with ENT if symptoms or not improving.  Return precautions discussed.  Patient expresses understanding and agreement with plan.  Discharged home in good condition.  Final Clinical Impression(s) / ED Diagnoses Final diagnoses:  Tonsillitis    Rx / DC Orders ED Discharge Orders  Ordered    clindamycin (CLEOCIN) 300 MG capsule  3 times daily     07/18/19 1126    ibuprofen (ADVIL) 800 MG tablet  3 times daily     07/18/19 [address] [place], [place] 07/18/19 1126    Pattricia Boss, MD 07/19/19 [phone]

## 2019-07-18 NOTE — ED Triage Notes (Signed)
Pt reports that Monday she went to [facility] for her sore throat. Reports that was given antibiotics shot. Reports swelling on right tonsil and neck has gotten worse making hard to swallow spit. Reports pain is worse and now causing right ear pains as well.

## 2019-07-19 ENCOUNTER — Ambulatory Visit: Payer: Managed Care, Other (non HMO) | Admitting: Family Medicine

## 2019-07-19 ENCOUNTER — Encounter: Payer: Self-pay | Admitting: Family Medicine

## 2019-07-19 VITALS — BP 138/83 | HR 99 | Temp 98.4°F | Ht 66.0 in | Wt 285.0 lb

## 2019-07-19 DIAGNOSIS — J028 Acute pharyngitis due to other specified organisms: Secondary | ICD-10-CM

## 2019-07-19 NOTE — Progress Notes (Signed)
Patient ID: Sheryl Keller, female    DOB: 04-Jul-1990  Age: 29 y.o. MRN: [id number]  Chief Complaint  Patient presents with  . Sore Throat    started on monday. pt states her lyphnodes are swollen and it having trouble swallowing. pt  states her R ear and side of her face she feels like ther is some pressure. pt states she has white spots  on her tonsels.     Subjective:   28 year old lady who has had a sore throat since Monday.  Earlier in the week she went to the emergency room urgent care and was treated for a possible strep even though the tests were negative.  She continued to have problems and went back to the ER.  She had a CT scan of her neck which revealed a little lymphadenopathy.  She had pus on her tonsils, primarily on the right.  She was treated with clindamycin.  Also got an injection of antibiotic in case she had gonococcal infection.  She has never had mono.  She did get steroids also. Current allergies, medications, problem list, past/family and social histories reviewed.  Objective:  BP 138/83   Pulse 99   Temp 98.4 F (36.9 C) (Temporal)   Ht 5\' 6"  (1.676 m)   Wt 285 lb (129.3 kg)   LMP 07/04/2019   SpO2 96%   BMI 46.00 kg/m   Does not feel well.  TMs are normal.  Throat has a lot of pus on the right tonsil.  Medium size nodes in her neck.  Chest clear.  Heart regular without murmurs.  No axillary or inguinal nodes.  No hepatosplenomegaly.  Assessment & Plan:   Assessment: 1. Pharyngitis due to other organism       Plan: Probably a viral sore throat that is taking so long.  Could be mono or a similar virus.  Mono test is pending.  I would just give it more time to run its course with some Tylenol and ibuprofen.  Assurance.  Orders Placed This Encounter  Procedures  . Gonococcus culture  . CBC  . Epstein-Barr virus VCA, IgM    No orders of the defined types were placed in this encounter.        Patient Instructions   This appears to probably be  a viral sore throat.  You have had enough antibiotics to treat most things.  However we are doing the gonorrhea culture just to be certain.  There is a reasonable chance at your age that this is infectious mononucleosis (Mono).  This is a type of a viral sore throat that takes longer to run its course typically.  There are some other viruses with fairly similar behavior.  Tied it through with Tylenol or ibuprofen and lozenges.  If you are not improving get rechecked, but I would give it a few more days.  We will try and let you know the results of the labs when they come back.  If abruptly worse at any time go to an emergency room or urgent care if needed, but I would give you a little more time first probably.   Pharyngitis  Pharyngitis is redness, pain, and swelling (inflammation) of the throat (pharynx). It is a very common cause of sore throat. Pharyngitis can be caused by a bacteria, but it is usually caused by a virus. Most cases of pharyngitis get better on their own without treatment. What are the causes? This condition may be caused by:  Infection by viruses (  viral). Viral pharyngitis spreads from person to person (is contagious) through coughing, sneezing, and sharing of personal items or utensils such as cups, forks, spoons, and toothbrushes.  Infection by bacteria (bacterial). Bacterial pharyngitis may be spread by touching the nose or face after coming in contact with the bacteria, or through more intimate contact, such as kissing.  Allergies. Allergies can cause buildup of mucus in the throat (post-nasal drip), leading to inflammation and irritation. Allergies can also cause blocked nasal passages, forcing breathing through the mouth, which dries and irritates the throat. What increases the risk? You are more likely to develop this condition if:  You are 72-4 years old.  You are exposed to crowded environments such as daycare, school, or dormitory living.  You live in a cold  climate.  You have a weakened disease-fighting (immune) system. What are the signs or symptoms? Symptoms of this condition vary by the cause (viral, bacterial, or allergies) and can include:  Sore throat.  Fatigue.  Low-grade fever.  Headache.  Joint pain and muscle aches.  Skin rashes.  Swollen glands in the throat (lymph nodes).  Plaque-like film on the throat or tonsils. This is often a symptom of bacterial pharyngitis.  Vomiting.  Stuffy nose (nasal congestion).  Cough.  Red, itchy eyes (conjunctivitis).  Loss of appetite. How is this diagnosed? This condition is often diagnosed based on your medical history and a physical exam. Your health care provider will ask you questions about your illness and your symptoms. A swab of your throat may be done to check for bacteria (rapid strep test). Other lab tests may also be done, depending on the suspected cause, but these are rare. How is this treated? This condition usually gets better in 3-4 days without medicine. Bacterial pharyngitis may be treated with antibiotic medicines. Follow these instructions at home:  Take over-the-counter and prescription medicines only as told by your health care provider. ? If you were prescribed an antibiotic medicine, take it as told by your health care provider. Do not stop taking the antibiotic even if you start to feel better. ? Do not give children aspirin because of the association with Reye syndrome.  Drink enough water and fluids to keep your urine clear or pale yellow.  Get a lot of rest.  Gargle with a salt-water mixture 3-4 times a day or as needed. To make a salt-water mixture, completely dissolve -1 tsp of salt in 1 cup of warm water.  If your health care provider approves, you may use throat lozenges or sprays to soothe your throat. Contact a health care provider if:  You have large, tender lumps in your neck.  You have a rash.  You cough up green, yellow-brown, or  bloody spit. Get help right away if:  Your neck becomes stiff.  You drool or are unable to swallow liquids.  You cannot drink or take medicines without vomiting.  You have severe pain that does not go away, even after you take medicine.  You have trouble breathing, and it is not caused by a stuffy nose.  You have new pain and swelling in your joints such as the knees, ankles, wrists, or elbows. Summary  Pharyngitis is redness, pain, and swelling (inflammation) of the throat (pharynx).  While pharyngitis can be caused by a bacteria, the most common causes are viral.  Most cases of pharyngitis get better on their own without treatment.  Bacterial pharyngitis is treated with antibiotic medicines. This information is not intended to replace  advice given to you by your health care provider. Make sure you discuss any questions you have with your health care provider. Document Revised: 04/21/2017 Document Reviewed: 06/14/2016 Elsevier Patient Education  [employer].  If you have lab work done today you will be contacted with your lab results within the next 2 weeks.  If you have not heard from [place] then please contact us. The fastest way to get your results is to register for My Chart.   IF you received an x-ray today, you will receive an invoice from [facility]. Please contact [facility] at [phone] with questions or concerns regarding your invoice.   IF you received labwork today, you will receive an invoice from [place]. Please contact [facility] at [phone] with questions or concerns regarding your invoice.   Our billing staff will not be able to assist you with questions regarding bills from these companies.  You will be contacted with the lab results as soon as they are available. The fastest way to get your results is to activate your My Chart account. Instructions are located on the last page of this paperwork. If you have not heard from [place]  regarding the results in 2 weeks, please contact this office.        Return if symptoms worsen or fail to improve.   Janace Hoard, MD 07/19/2019 Patient ID: Sheryl Keller, female    DOB: [birth year]  Age: 29 y.o. MRN: [id number]  Chief Complaint  Patient presents with  . Sore Throat    started on monday. pt states her lyphnodes are swollen and it having trouble swallowing. pt  states her R ear and side of her face she feels like ther is some pressure. pt states she has white spots  on her tonsels.     Subjective:   The computer made a duplicate note.  Current allergies, medications, problem list, past/family and social histories reviewed.  Objective:  BP 138/83   Pulse 99   Temp 98.4 F (36.9 C) (Temporal)   Ht 5\' 6"  (1.676 m)   Wt 285 lb (129.3 kg)   LMP 07/04/2019   SpO2 96%   BMI 46.00 kg/m   The computer made a duplicate note.  Assessment & Plan:   Assessment: 1. Pharyngitis due to other organism       Plan: The computer made a duplicate note.  Orders Placed This Encounter  Procedures  . Gonococcus culture  . CBC  . Epstein-Barr virus VCA, IgM    No orders of the defined types were placed in this encounter.        Patient Instructions   This appears to probably be a viral sore throat.  You have had enough antibiotics to treat most things.  However we are doing the gonorrhea culture just to be certain.  There is a reasonable chance at your age that this is infectious mononucleosis (Mono).  This is a type of a viral sore throat that takes longer to run its course typically.  There are some other viruses with fairly similar behavior.  Tied it through with Tylenol or ibuprofen and lozenges.  If you are not improving get rechecked, but I would give it a few more days.  We will try and let you know the results of the labs when they come back.  If abruptly worse at any time go to an emergency room or urgent care if needed, but I would give you a  little more time first probably.  Pharyngitis  Pharyngitis is redness, pain, and swelling (inflammation) of the throat (pharynx). It is a very common cause of sore throat. Pharyngitis can be caused by a bacteria, but it is usually caused by a virus. Most cases of pharyngitis get better on their own without treatment. What are the causes? This condition may be caused by:  Infection by viruses (viral). Viral pharyngitis spreads from person to person (is contagious) through coughing, sneezing, and sharing of personal items or utensils such as cups, forks, spoons, and toothbrushes.  Infection by bacteria (bacterial). Bacterial pharyngitis may be spread by touching the nose or face after coming in contact with the bacteria, or through more intimate contact, such as kissing.  Allergies. Allergies can cause buildup of mucus in the throat (post-nasal drip), leading to inflammation and irritation. Allergies can also cause blocked nasal passages, forcing breathing through the mouth, which dries and irritates the throat. What increases the risk? You are more likely to develop this condition if:  You are 71-65 years old.  You are exposed to crowded environments such as daycare, school, or dormitory living.  You live in a cold climate.  You have a weakened disease-fighting (immune) system. What are the signs or symptoms? Symptoms of this condition vary by the cause (viral, bacterial, or allergies) and can include:  Sore throat.  Fatigue.  Low-grade fever.  Headache.  Joint pain and muscle aches.  Skin rashes.  Swollen glands in the throat (lymph nodes).  Plaque-like film on the throat or tonsils. This is often a symptom of bacterial pharyngitis.  Vomiting.  Stuffy nose (nasal congestion).  Cough.  Red, itchy eyes (conjunctivitis).  Loss of appetite. How is this diagnosed? This condition is often diagnosed based on your medical history and a physical exam. Your health care  provider will ask you questions about your illness and your symptoms. A swab of your throat may be done to check for bacteria (rapid strep test). Other lab tests may also be done, depending on the suspected cause, but these are rare. How is this treated? This condition usually gets better in 3-4 days without medicine. Bacterial pharyngitis may be treated with antibiotic medicines. Follow these instructions at home:  Take over-the-counter and prescription medicines only as told by your health care provider. ? If you were prescribed an antibiotic medicine, take it as told by your health care provider. Do not stop taking the antibiotic even if you start to feel better. ? Do not give children aspirin because of the association with Reye syndrome.  Drink enough water and fluids to keep your urine clear or pale yellow.  Get a lot of rest.  Gargle with a salt-water mixture 3-4 times a day or as needed. To make a salt-water mixture, completely dissolve -1 tsp of salt in 1 cup of warm water.  If your health care provider approves, you may use throat lozenges or sprays to soothe your throat. Contact a health care provider if:  You have large, tender lumps in your neck.  You have a rash.  You cough up green, yellow-brown, or bloody spit. Get help right away if:  Your neck becomes stiff.  You drool or are unable to swallow liquids.  You cannot drink or take medicines without vomiting.  You have severe pain that does not go away, even after you take medicine.  You have trouble breathing, and it is not caused by a stuffy nose.  You have new pain and swelling in your joints such as  the knees, ankles, wrists, or elbows. Summary  Pharyngitis is redness, pain, and swelling (inflammation) of the throat (pharynx).  While pharyngitis can be caused by a bacteria, the most common causes are viral.  Most cases of pharyngitis get better on their own without treatment.  Bacterial pharyngitis is  treated with antibiotic medicines. This information is not intended to replace advice given to you by your health care provider. Make sure you discuss any questions you have with your health care provider. Document Revised: 04/21/2017 Document Reviewed: 06/14/2016 Elsevier Patient Education  [employer].  If you have lab work done today you will be contacted with your lab results within the next 2 weeks.  If you have not heard from [place] then please contact us. The fastest way to get your results is to register for My Chart.   IF you received an x-ray today, you will receive an invoice from [facility]. Please contact [facility] at [phone] with questions or concerns regarding your invoice.   IF you received labwork today, you will receive an invoice from [place]. Please contact [facility] at [phone] with questions or concerns regarding your invoice.   Our billing staff will not be able to assist you with questions regarding bills from these companies.  You will be contacted with the lab results as soon as they are available. The fastest way to get your results is to activate your My Chart account. Instructions are located on the last page of this paperwork. If you have not heard from [place] regarding the results in 2 weeks, please contact this office.        Return if symptoms worsen or fail to improve.   Ruben Reason, MD 07/19/2019

## 2019-07-19 NOTE — Patient Instructions (Addendum)
This appears to probably be a viral sore throat.  You have had enough antibiotics to treat most things.  However we are doing the gonorrhea culture just to be certain.  There is a reasonable chance at your age that this is infectious mononucleosis (Mono).  This is a type of a viral sore throat that takes longer to run its course typically.  There are some other viruses with fairly similar behavior.  Tied it through with Tylenol or ibuprofen and lozenges.  If you are not improving get rechecked, but I would give it a few more days.  We will try and let you know the results of the labs when they come back.  If abruptly worse at any time go to an emergency room or urgent care if needed, but I would give you a little more time first probably.   Pharyngitis  Pharyngitis is redness, pain, and swelling (inflammation) of the throat (pharynx). It is a very common cause of sore throat. Pharyngitis can be caused by a bacteria, but it is usually caused by a virus. Most cases of pharyngitis get better on their own without treatment. What are the causes? This condition may be caused by:  Infection by viruses (viral). Viral pharyngitis spreads from person to person (is contagious) through coughing, sneezing, and sharing of personal items or utensils such as cups, forks, spoons, and toothbrushes.  Infection by bacteria (bacterial). Bacterial pharyngitis may be spread by touching the nose or face after coming in contact with the bacteria, or through more intimate contact, such as kissing.  Allergies. Allergies can cause buildup of mucus in the throat (post-nasal drip), leading to inflammation and irritation. Allergies can also cause blocked nasal passages, forcing breathing through the mouth, which dries and irritates the throat. What increases the risk? You are more likely to develop this condition if:  You are 17-42 years old.  You are exposed to crowded environments such as daycare, school, or dormitory  living.  You live in a cold climate.  You have a weakened disease-fighting (immune) system. What are the signs or symptoms? Symptoms of this condition vary by the cause (viral, bacterial, or allergies) and can include:  Sore throat.  Fatigue.  Low-grade fever.  Headache.  Joint pain and muscle aches.  Skin rashes.  Swollen glands in the throat (lymph nodes).  Plaque-like film on the throat or tonsils. This is often a symptom of bacterial pharyngitis.  Vomiting.  Stuffy nose (nasal congestion).  Cough.  Red, itchy eyes (conjunctivitis).  Loss of appetite. How is this diagnosed? This condition is often diagnosed based on your medical history and a physical exam. Your health care provider will ask you questions about your illness and your symptoms. A swab of your throat may be done to check for bacteria (rapid strep test). Other lab tests may also be done, depending on the suspected cause, but these are rare. How is this treated? This condition usually gets better in 3-4 days without medicine. Bacterial pharyngitis may be treated with antibiotic medicines. Follow these instructions at home:  Take over-the-counter and prescription medicines only as told by your health care provider. ? If you were prescribed an antibiotic medicine, take it as told by your health care provider. Do not stop taking the antibiotic even if you start to feel better. ? Do not give children aspirin because of the association with Reye syndrome.  Drink enough water and fluids to keep your urine clear or pale yellow.  Get a lot  of rest.  Gargle with a salt-water mixture 3-4 times a day or as needed. To make a salt-water mixture, completely dissolve -1 tsp of salt in 1 cup of warm water.  If your health care provider approves, you may use throat lozenges or sprays to soothe your throat. Contact a health care provider if:  You have large, tender lumps in your neck.  You have a rash.  You cough  up green, yellow-brown, or bloody spit. Get help right away if:  Your neck becomes stiff.  You drool or are unable to swallow liquids.  You cannot drink or take medicines without vomiting.  You have severe pain that does not go away, even after you take medicine.  You have trouble breathing, and it is not caused by a stuffy nose.  You have new pain and swelling in your joints such as the knees, ankles, wrists, or elbows. Summary  Pharyngitis is redness, pain, and swelling (inflammation) of the throat (pharynx).  While pharyngitis can be caused by a bacteria, the most common causes are viral.  Most cases of pharyngitis get better on their own without treatment.  Bacterial pharyngitis is treated with antibiotic medicines. This information is not intended to replace advice given to you by your health care provider. Make sure you discuss any questions you have with your health care provider. Document Revised: 04/21/2017 Document Reviewed: 06/14/2016 Elsevier Patient Education  [employer].  If you have lab work done today you will be contacted with your lab results within the next 2 weeks.  If you have not heard from [place] then please contact us. The fastest way to get your results is to register for My Chart.   IF you received an x-ray today, you will receive an invoice from [facility]. Please contact [facility] at [phone] with questions or concerns regarding your invoice.   IF you received labwork today, you will receive an invoice from [place]. Please contact [facility] at [phone] with questions or concerns regarding your invoice.   Our billing staff will not be able to assist you with questions regarding bills from these companies.  You will be contacted with the lab results as soon as they are available. The fastest way to get your results is to activate your My Chart account. Instructions are located on the last page of this paperwork. If you  have not heard from [place] regarding the results in 2 weeks, please contact this office.

## 2019-07-20 LAB — CBC
Hematocrit: 34.9 % (ref 34.0–46.6)
Hemoglobin: 11.3 g/dL (ref 11.1–15.9)
MCH: 25.2 pg — ABNORMAL LOW (ref 26.6–33.0)
MCHC: 32.4 g/dL (ref 31.5–35.7)
MCV: 78 fL — ABNORMAL LOW (ref 79–97)
Platelets: 368 10*3/uL (ref 150–450)
RBC: 4.48 x10E6/uL (ref 3.77–5.28)
RDW: 16.1 % — ABNORMAL HIGH (ref 11.7–15.4)
WBC: 14.5 10*3/uL — ABNORMAL HIGH (ref 3.4–10.8)

## 2019-07-22 LAB — EPSTEIN-BARR VIRUS VCA, IGM: EBV VCA IgM: <36 U/mL (ref 0.0–35.9)

## 2019-07-22 LAB — GC NAA, PHARYNGEAL: N GONORRHOEA RRNA NPH QL PCR: NEGATIVE

## 2019-07-24 NOTE — Progress Notes (Signed)
Call patient:  Labs are okay for throat culture for gc and mono test.  WBC was a little elevated, but I believe she had had a steroid shot elsewhere which would probably elevate it some.  If she is not improving she can return for a recheck, but I suspect it was a viral infection and has run its course since I see no other notes.  Janace Hoard MD

## 2019-07-27 ENCOUNTER — Other Ambulatory Visit: Payer: Self-pay | Admitting: Family Medicine

## 2019-08-13 ENCOUNTER — Telehealth: Payer: Self-pay | Admitting: Family Medicine

## 2019-08-13 NOTE — Telephone Encounter (Signed)
Patient calling to request refill for iron medication. Unable to find in current medication list.  Pharmacy:  [id number] [place], [place] - [address] Phone:  [phone]  Fax:  [phone]

## 2019-08-13 NOTE — Telephone Encounter (Signed)
Called pt and scheduled her for f/u

## 2019-08-13 NOTE — Telephone Encounter (Signed)
Refill request denied. Last CBC she did not have anemia. Last OV with me for anemia was a year ago. She needs OV. thanks

## 2019-08-13 NOTE — Telephone Encounter (Signed)
Patient is requesting a refill of the following medications: Ferrous sulfate  Requested Prescriptions    No prescriptions requested or ordered in this encounter    Date of patient request: 08/13/2019 Last office visit: 07/19/2019 Date of last refill: 10/2017 Last refill amount: 30  Pt called stating she has been tired and her labs showed she needed iron again. Last rx 10/2017. Okay to send? Or would you like to see her?

## 2019-08-23 ENCOUNTER — Other Ambulatory Visit: Payer: Self-pay | Admitting: Family Medicine

## 2019-08-23 NOTE — Telephone Encounter (Signed)
Requested Prescriptions  Pending Prescriptions Disp Refills  . SPRINTEC 28 0.25-35 MG-MCG tablet [Pharmacy Med Name: SPRINTEC 28 DAY TABLET] 28 tablet 1    Sig: TAKE 1 TABLET BY MOUTH EVERY DAY     OB/GYN:  Contraceptives Passed - 08/23/2019  2:30 PM      Passed - Last BP in normal range    BP Readings from Last 1 Encounters:  07/19/19 138/83         Passed - Valid encounter within last 12 months    Recent Outpatient Visits          1 month ago Pharyngitis due to other organism   [facility] Care at [facility], Sandria Bales, MD   4 months ago Routine screening for STI (sexually transmitted infection)   Primary Care at [facility], Asencion Partridge, MD   10 months ago Pain of left hip joint   [facility] at [facility], Meda Coffee, MD   11 months ago Annual physical exam   [facility] at [facility], Meda Coffee, MD   1 year ago Trochanteric bursitis of left hip   [facility] at [facility], Meda Coffee, MD      Future Appointments            In 3 weeks Myles Lipps, MD Primary Care at [place], [facility]

## 2019-09-19 ENCOUNTER — Other Ambulatory Visit: Payer: Self-pay

## 2019-09-19 ENCOUNTER — Encounter: Payer: Self-pay | Admitting: Family Medicine

## 2019-09-19 ENCOUNTER — Ambulatory Visit: Payer: Managed Care, Other (non HMO) | Admitting: Family Medicine

## 2019-09-19 VITALS — BP 129/84 | HR 86 | Temp 97.8°F | Ht 66.0 in | Wt 296.0 lb

## 2019-09-19 DIAGNOSIS — Z862 Personal history of diseases of the blood and blood-forming organs and certain disorders involving the immune mechanism: Secondary | ICD-10-CM

## 2019-09-19 DIAGNOSIS — R5383 Other fatigue: Secondary | ICD-10-CM | POA: Diagnosis not present

## 2019-09-19 NOTE — Progress Notes (Signed)
4/29/20218:17 AM  Sheryl Keller 02/26/1991, 29 y.o., female [id number]  Chief Complaint  Patient presents with  . Anemia    f/u    HPI:   Patient is a 29 y.o. 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 She has been trying to sleep more, at least get 6 hours and drinking more water - this is helping She had also stopped going to the gym and has gained weight due to covid restrictions Does not think she snores regularly  Last CBC in feb 2021 she had received steroid injection couple days prior Lab Results  Component Value Date   WBC 14.5 (H) 07/19/2019   HGB 11.3 07/19/2019   HCT 34.9 07/19/2019   MCV 78 (L) 07/19/2019   PLT 368 07/19/2019   Wt Readings from Last 3 Encounters:  09/19/19 296 lb (134.3 kg)  07/19/19 285 lb (129.3 kg)  04/19/19 265 lb 12.8 oz (120.6 kg)    Depression screen [facility] 2/9 09/19/2019 07/19/2019 04/19/2019  Decreased Interest 0 0 0  Down, Depressed, Hopeless 0 0 0  PHQ - 2 Score 0 0 0    Fall Risk  09/19/2019 07/19/2019 04/19/2019 10/19/2018 08/31/2018  Falls in the past year? 0 0 0 0 0  Number falls in past yr: 0 0 0 0 0  Injury with Fall? 0 0 - 0 0  Follow up Falls evaluation completed Falls evaluation completed Falls evaluation completed - -     No Known Allergies  Prior to Admission medications   Medication Sig Start Date End Date Taking? Authorizing Provider  SPRINTEC 28 0.25-35 MG-MCG tablet TAKE 1 TABLET BY MOUTH EVERY DAY 08/23/19  Yes Myles Lipps, MD  ibuprofen (ADVIL) 800 MG tablet Take 1 tablet (800 mg total) by mouth 3 (three) times daily. Patient not taking: Reported on 09/19/2019 07/18/19   Dartha Lodge, PA-C    No past medical history on file.  No past surgical history on file.  Social History   Tobacco Use  . Smoking  status: Never Smoker  . Smokeless tobacco: Never Used  Substance Use Topics  . Alcohol use: Yes    Comment: occ    Family History  Problem Relation Age of Onset  . Hypertension Mother   . Diabetes Mother   . Stroke Mother   . Healthy Father   . Stroke Maternal Grandmother   . Diabetes Maternal Grandfather     ROS Per hpi  OBJECTIVE:  Today's Vitals   09/19/19 0811  BP: 129/84  Pulse: 86  Temp: 97.8 F (36.6 C)  SpO2: 97%  Weight: 296 lb (134.3 kg)  Height: 5\' 6"  (1.676 m)   Body mass index is 47.78 kg/m.   Physical Exam Vitals and nursing note reviewed.  Constitutional:      Appearance: She is well-developed.  HENT:     Head: Normocephalic and atraumatic.  Eyes:     General: No scleral icterus.    Conjunctiva/sclera: Conjunctivae normal.     Pupils: Pupils are equal, round, and reactive to light.  Pulmonary:     Effort: Pulmonary effort is normal.  Musculoskeletal:     Cervical back: Neck supple.  Skin:    General: Skin is warm and dry.  Neurological:     Mental Status: She is alert  and oriented to person, place, and time.     No results found for this or any previous visit (from the past 24 hour(s)).  No results found.   ASSESSMENT and PLAN  1. Fatigue, unspecified type Improving with LFM. Cont working on sleep, hydration and exercise.  2. History of iron deficiency anemia Last CBC no anemia, menorrhagia resolved on OCPs, defer further testing to CPE as fatigue is improving  Return in about 6 months (around 03/20/2020) for CPE.    Rutherford Guys, MD [facility], [place] [address] Ph.  [phone] Fax [phone]

## 2019-09-19 NOTE — Patient Instructions (Signed)
° ° ° °  If you have lab work done today you will be contacted with your lab results within the next 2 weeks.  If you have not heard from us then please contact us. The fastest way to get your results is to register for My Chart. ° ° °IF you received an x-ray today, you will receive an invoice from [facility]. Please contact [facility] at [phone] with questions or concerns regarding your invoice.  ° °IF you received labwork today, you will receive an invoice from [facility]. Please contact [facility] at [phone] with questions or concerns regarding your invoice.  ° °Our billing staff will not be able to assist you with questions regarding bills from these companies. ° °You will be contacted with the lab results as soon as they are available. The fastest way to get your results is to activate your My Chart account. Instructions are located on the last page of this paperwork. If you have not heard from us regarding the results in 2 weeks, please contact this office. °  ° ° ° °

## 2019-09-20 ENCOUNTER — Other Ambulatory Visit: Payer: Self-pay | Admitting: Family Medicine

## 2019-10-18 ENCOUNTER — Other Ambulatory Visit: Payer: Self-pay | Admitting: Family Medicine

## 2019-10-18 NOTE — Telephone Encounter (Signed)
Due AEX- 10/21 per last OV

## 2020-02-28 ENCOUNTER — Other Ambulatory Visit: Payer: Self-pay | Admitting: Family Medicine

## 2020-02-28 NOTE — Telephone Encounter (Signed)
   Notes to clinic: review for refill Not on medication list    Requested Prescriptions  Pending Prescriptions Disp Refills   norgestimate-ethinyl estradiol (ORTHO-CYCLEN) 0.25-35 MG-MCG tablet [Pharmacy Med Name: NORG-ETHIN ESTRA 0.25-0.035 MG] 28 tablet 4    Sig: TAKE 1 TABLET BY MOUTH EVERY DAY      OB/GYN:  Contraceptives Passed - 02/28/2020  1:43 AM      Passed - Last BP in normal range    BP Readings from Last 1 Encounters:  09/19/19 129/84          Passed - Valid encounter within last 12 months    Recent Outpatient Visits           5 months ago Fatigue, unspecified type   [facility], Meda Coffee, MD   7 months ago Pharyngitis due to other organism   [facility] at [facility], Sandria Bales, MD   10 months ago Routine screening for STI (sexually transmitted infection)   [facility], Asencion Partridge, MD   1 year ago Pain of left hip joint   [facility], Meda Coffee, MD   1 year ago Annual physical exam   [facility], Meda Coffee, MD

## 2020-03-01 IMAGING — CT CT NECK W/ CM
3 of 4 series · 13 of 33 positions shown, 16 images · IV contrast (OMNIPAQUE)
Comparison: None.

CLINICAL DATA: Sore throat difficulty swallowing. Recent
antibiotics.

EXAM:
CT NECK WITH CONTRAST
TECHNIQUE: Multidetector CT imaging of the neck was performed using the
standard protocol following the bolus administration of intravenous
contrast.
CONTRAST:  75mL OMNIPAQUE IOHEXOL 300 MG/ML  SOLN

[Series 5: orthogonal ax · axial · 0.47mm/px · z∈[+1422,+1609]mm · 5 of 134 slices shown, 7 images]
[im 20/134  soft-tissue]
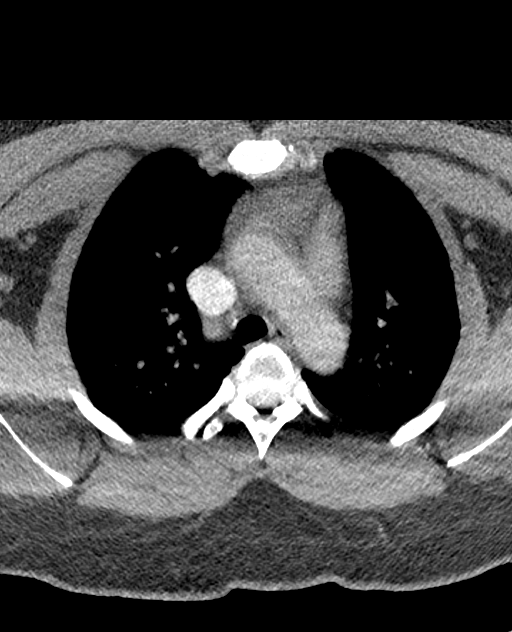
[im 20/134  bone]
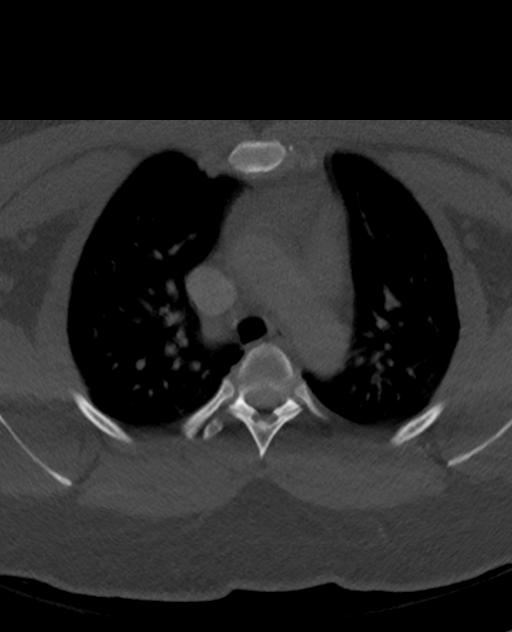
[im 39/134  bone]
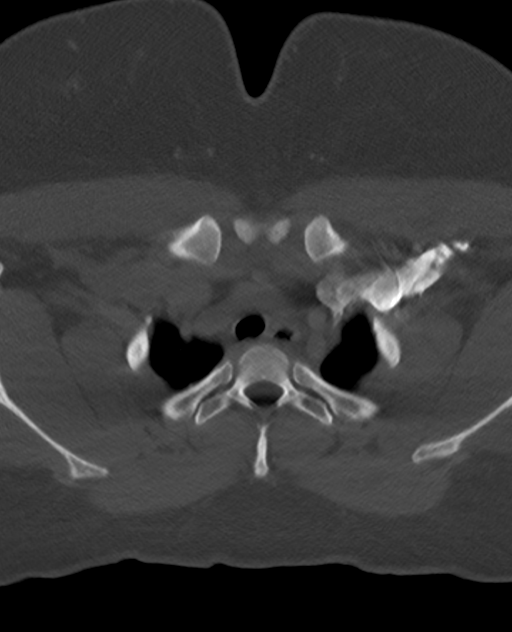
[im 77/134  bone]
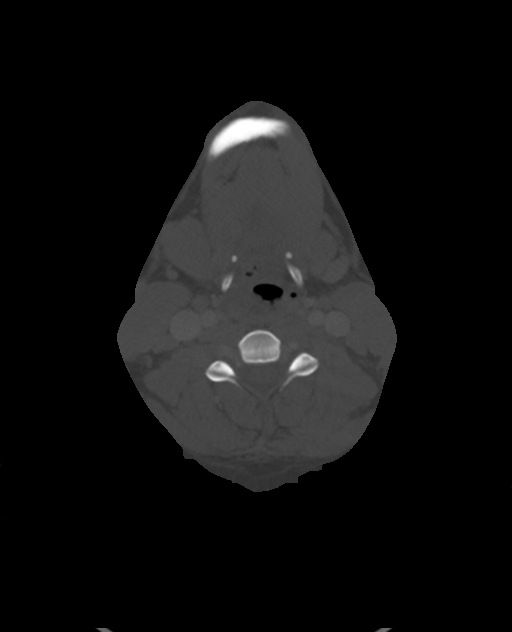
[im 96/134  bone]
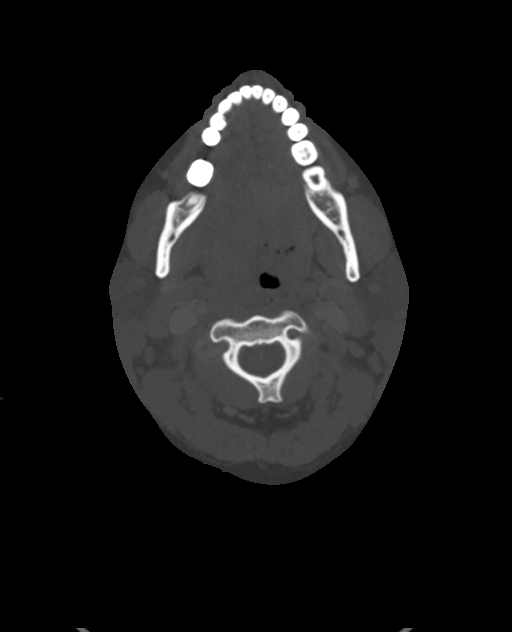
[im 115/134  soft-tissue]
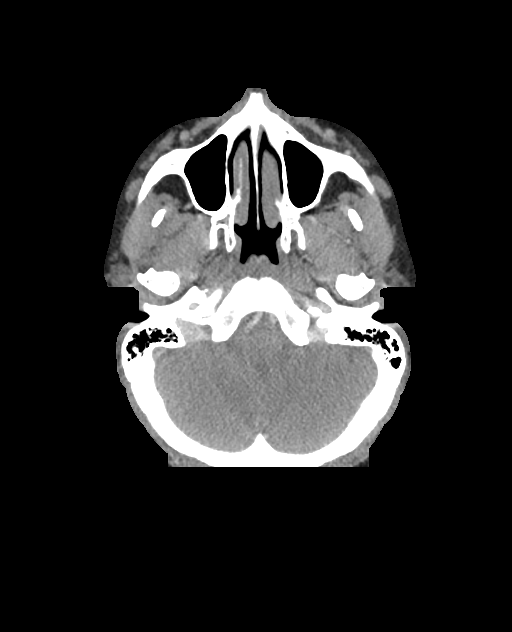
[im 115/134  bone]
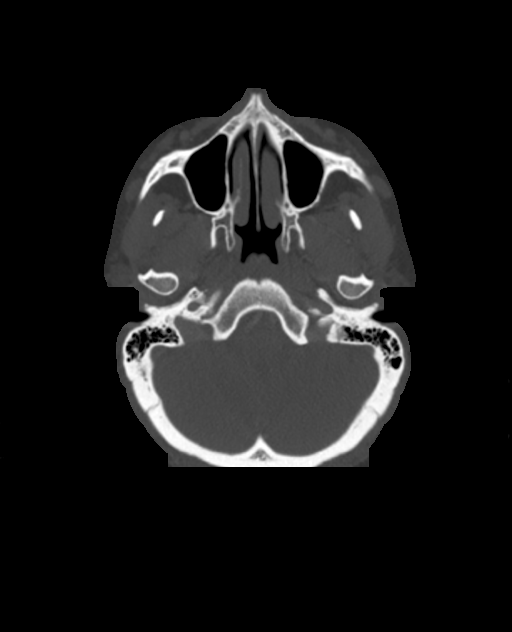

[Series 6: cor neck · coronal · 0.47mm/px · 3 of 111 slices shown]
[im 29/111  bone]
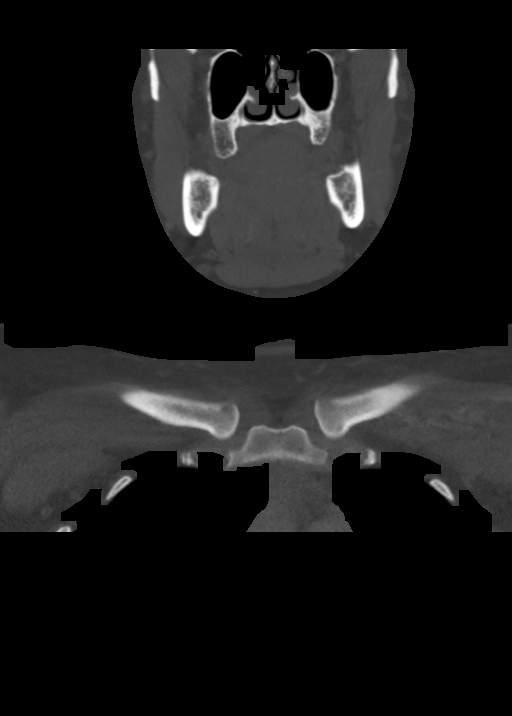
[im 47/111  bone]
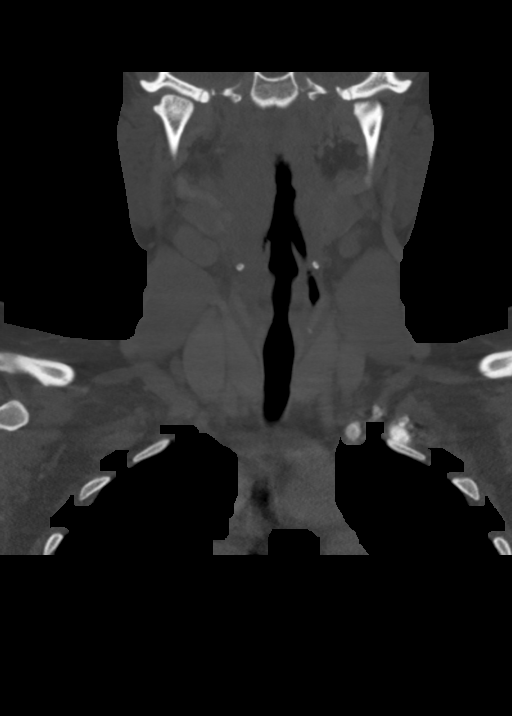
[im 64/111  bone]
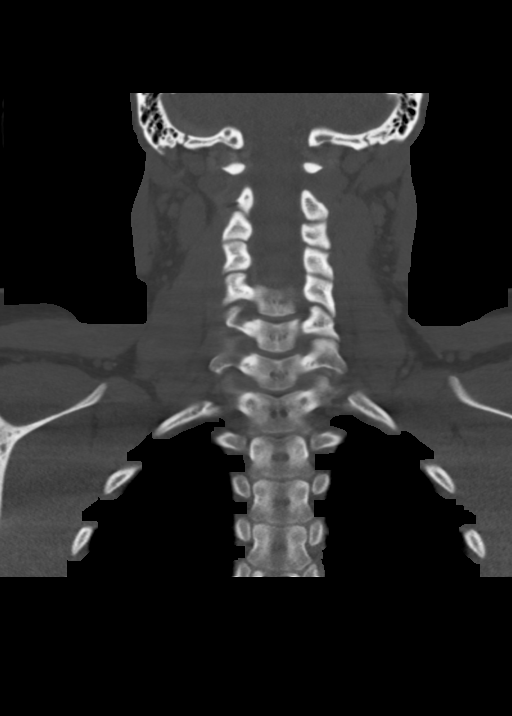

[Series 7: sag neck · sagittal · 0.53mm/px · 5 of 116 slices shown, 6 images]
[im 39/116  bone]
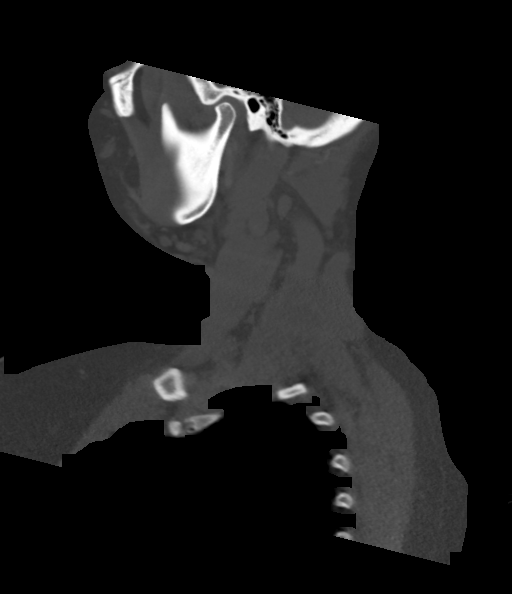
[im 48/116  bone]
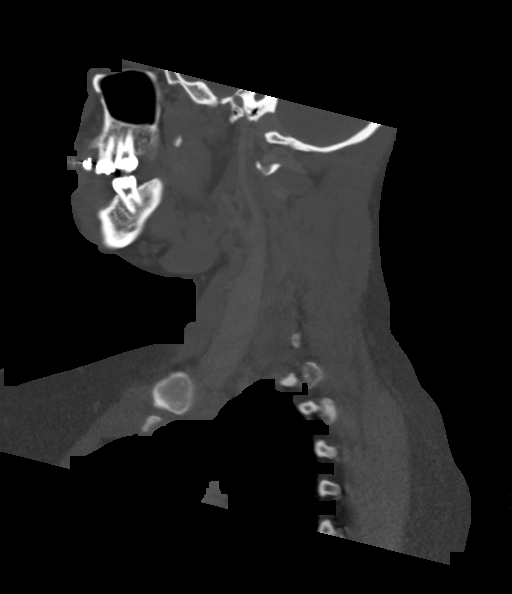
[im 58/116  soft-tissue]
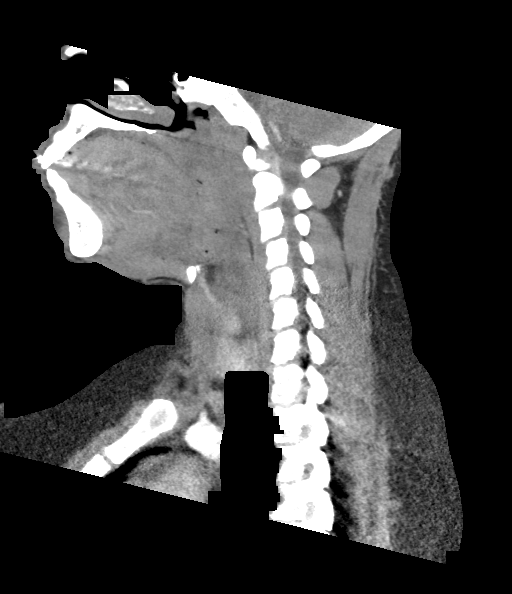
[im 58/116  bone]
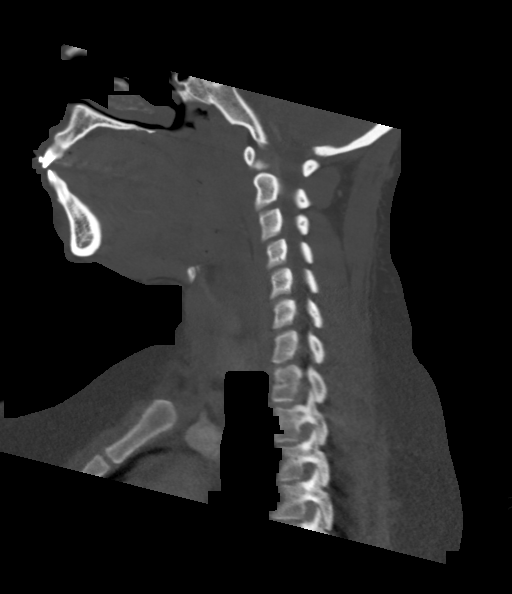
[im 68/116  bone]
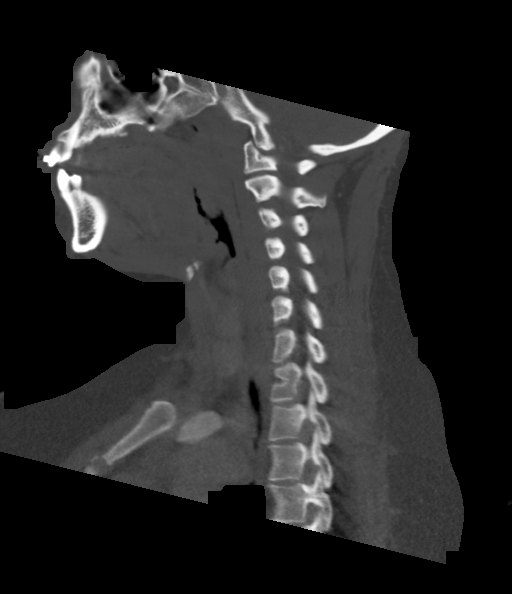
[im 77/116  bone]
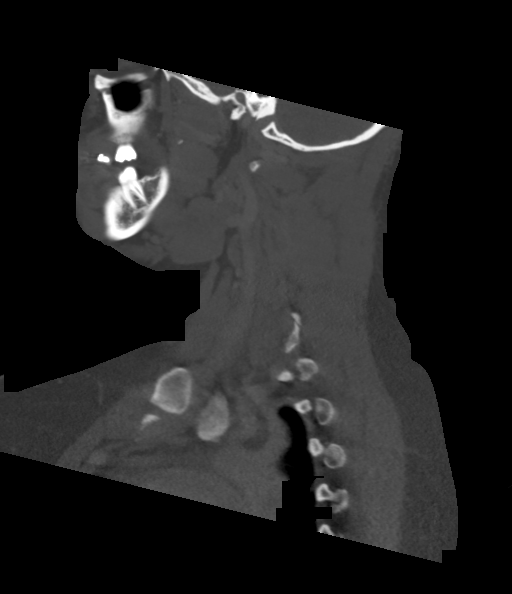

[13 of 33 positions shown; findings below may reference images not displayed]

FINDINGS: Pharynx and larynx: Asymmetric enlargement of the right tonsil
without abscess or mass. Mild enlargement left tonsil. Edema in the
soft palate and uvula. Negative larynx. Airway intact. Epiglottis
not swollen.

Salivary glands: No inflammation, mass, or stone.

Thyroid: Negative

Lymph nodes: Right level 2 lymph node 16 mm. Additional 1 cm level 2
lymph node. Subcentimeter right level 5 lymph nodes

Subcentimeter level 2 and level 5 lymph nodes on the left. No
necrotic nodes.

Vascular: Normal vascular enhancement.

Limited intracranial: Negative

Visualized orbits: Negative

Mastoids and visualized paranasal sinuses: Negative

Skeleton: Negative

Upper chest: Negative

Other: None
IMPRESSION: Asymmetric enlargement right tonsil without abscess. Mild
enlargement left tonsil. Edema in the soft palate and uvula. Mild
reactive lymph nodes in the neck bilaterally with 16 mm lymph node
right level 2 station.

Findings most consistent with pharyngitis/tonsillitis without
abscess.

## 2020-07-06 ENCOUNTER — Other Ambulatory Visit: Payer: Self-pay

## 2020-07-06 ENCOUNTER — Telehealth: Payer: Self-pay

## 2020-07-06 MED ORDER — NORGESTIMATE-ETH ESTRADIOL 0.25-35 MG-MCG PO TABS: 1.0000 | ORAL_TABLET | Freq: Every day | ORAL | 3 refills | Status: AC

## 2020-07-06 NOTE — Telephone Encounter (Signed)
[facility] [address] [place], [address]

## 2020-07-06 NOTE — Telephone Encounter (Signed)
transfered
